# Patient Record
Sex: Female | Born: 1975
Health system: Southern US, Community
[De-identification: ages and names within clinical notes are randomized; demographics above are authoritative.]

## PROBLEM LIST (undated history)

## (undated) DIAGNOSIS — Z923 Personal history of irradiation: Secondary | ICD-10-CM

## (undated) DIAGNOSIS — Z30430 Encounter for insertion of intrauterine contraceptive device: Secondary | ICD-10-CM

## (undated) DIAGNOSIS — Z872 Personal history of diseases of the skin and subcutaneous tissue: Secondary | ICD-10-CM

## (undated) DIAGNOSIS — G43909 Migraine, unspecified, not intractable, without status migrainosus: Secondary | ICD-10-CM

## (undated) DIAGNOSIS — C801 Malignant (primary) neoplasm, unspecified: Secondary | ICD-10-CM

## (undated) DIAGNOSIS — F419 Anxiety disorder, unspecified: Secondary | ICD-10-CM

## (undated) DIAGNOSIS — F439 Reaction to severe stress, unspecified: Secondary | ICD-10-CM

## (undated) HISTORY — DX: Anxiety disorder, unspecified: F41.9

## (undated) HISTORY — PX: OTHER SURGICAL HISTORY: SHX169

## (undated) HISTORY — PX: TONSILLECTOMY AND ADENOIDECTOMY: SHX28

## (undated) HISTORY — DX: Migraine, unspecified, not intractable, without status migrainosus: G43.909

## (undated) HISTORY — DX: Encounter for insertion of intrauterine contraceptive device: Z30.430

## (undated) HISTORY — DX: Reaction to severe stress, unspecified: F43.9

## (undated) HISTORY — DX: Personal history of diseases of the skin and subcutaneous tissue: Z87.2

---

## 1998-03-11 ENCOUNTER — Other Ambulatory Visit: Admission: RE | Admit: 1998-03-11 | Discharge: 1998-03-11 | Payer: Self-pay | Admitting: Gynecology

## 1999-03-26 ENCOUNTER — Other Ambulatory Visit: Admission: RE | Admit: 1999-03-26 | Discharge: 1999-03-26 | Payer: Self-pay | Admitting: Gynecology

## 1999-06-27 ENCOUNTER — Emergency Department (HOSPITAL_COMMUNITY): Admission: EM | Admit: 1999-06-27 | Discharge: 1999-06-27 | Payer: Self-pay | Admitting: *Deleted

## 2000-02-12 ENCOUNTER — Other Ambulatory Visit: Admission: RE | Admit: 2000-02-12 | Discharge: 2000-02-12 | Payer: Self-pay | Admitting: Obstetrics & Gynecology

## 2000-09-05 ENCOUNTER — Inpatient Hospital Stay (HOSPITAL_COMMUNITY): Admission: AD | Admit: 2000-09-05 | Discharge: 2000-09-07 | Payer: Self-pay | Admitting: Obstetrics & Gynecology

## 2001-01-04 ENCOUNTER — Encounter: Admission: RE | Admit: 2001-01-04 | Discharge: 2001-02-03 | Payer: Self-pay | Admitting: Obstetrics & Gynecology

## 2001-03-06 ENCOUNTER — Encounter: Admission: RE | Admit: 2001-03-06 | Discharge: 2001-04-05 | Payer: Self-pay | Admitting: Obstetrics & Gynecology

## 2001-03-15 ENCOUNTER — Other Ambulatory Visit: Admission: RE | Admit: 2001-03-15 | Discharge: 2001-03-15 | Payer: Self-pay | Admitting: Gynecology

## 2001-04-06 ENCOUNTER — Encounter: Admission: RE | Admit: 2001-04-06 | Discharge: 2001-05-06 | Payer: Self-pay | Admitting: Obstetrics & Gynecology

## 2002-06-16 ENCOUNTER — Inpatient Hospital Stay (HOSPITAL_COMMUNITY): Admission: AD | Admit: 2002-06-16 | Discharge: 2002-06-18 | Payer: Self-pay | Admitting: Obstetrics & Gynecology

## 2002-07-17 ENCOUNTER — Other Ambulatory Visit: Admission: RE | Admit: 2002-07-17 | Discharge: 2002-07-17 | Payer: Self-pay | Admitting: Obstetrics and Gynecology

## 2004-01-15 ENCOUNTER — Other Ambulatory Visit: Admission: RE | Admit: 2004-01-15 | Discharge: 2004-01-15 | Payer: Self-pay | Admitting: Obstetrics and Gynecology

## 2005-01-18 ENCOUNTER — Other Ambulatory Visit: Admission: RE | Admit: 2005-01-18 | Discharge: 2005-01-18 | Payer: Self-pay | Admitting: Obstetrics and Gynecology

## 2010-03-01 ENCOUNTER — Encounter: Payer: Self-pay | Admitting: Obstetrics and Gynecology

## 2012-04-26 ENCOUNTER — Other Ambulatory Visit: Payer: Self-pay | Admitting: Obstetrics and Gynecology

## 2012-04-26 DIAGNOSIS — N63 Unspecified lump in unspecified breast: Secondary | ICD-10-CM

## 2012-05-05 ENCOUNTER — Other Ambulatory Visit: Payer: Self-pay

## 2012-05-08 ENCOUNTER — Other Ambulatory Visit: Payer: Self-pay

## 2012-05-18 ENCOUNTER — Ambulatory Visit
Admission: RE | Admit: 2012-05-18 | Discharge: 2012-05-18 | Disposition: A | Discharge status: Home / Self Care | Payer: Self-pay | Source: Ambulatory Visit | Attending: Obstetrics and Gynecology | Admitting: Obstetrics and Gynecology

## 2012-05-18 DIAGNOSIS — N63 Unspecified lump in unspecified breast: Secondary | ICD-10-CM

## 2012-12-21 ENCOUNTER — Other Ambulatory Visit: Payer: Self-pay | Admitting: *Deleted

## 2012-12-21 ENCOUNTER — Other Ambulatory Visit: Payer: Self-pay | Admitting: Obstetrics and Gynecology

## 2012-12-21 DIAGNOSIS — N644 Mastodynia: Secondary | ICD-10-CM

## 2013-01-17 ENCOUNTER — Ambulatory Visit
Admission: RE | Admit: 2013-01-17 | Discharge: 2013-01-17 | Disposition: A | Discharge status: Home / Self Care | Payer: BC Managed Care – PPO | Source: Ambulatory Visit | Attending: Obstetrics and Gynecology | Admitting: Obstetrics and Gynecology

## 2013-01-17 ENCOUNTER — Other Ambulatory Visit: Payer: Self-pay | Admitting: Obstetrics and Gynecology

## 2013-01-17 DIAGNOSIS — N644 Mastodynia: Secondary | ICD-10-CM

## 2013-08-27 ENCOUNTER — Encounter (INDEPENDENT_AMBULATORY_CARE_PROVIDER_SITE_OTHER): Payer: Self-pay | Admitting: Surgery

## 2013-09-11 ENCOUNTER — Ambulatory Visit (INDEPENDENT_AMBULATORY_CARE_PROVIDER_SITE_OTHER): Payer: Self-pay | Admitting: Surgery

## 2013-09-13 ENCOUNTER — Ambulatory Visit (INDEPENDENT_AMBULATORY_CARE_PROVIDER_SITE_OTHER): Payer: BC Managed Care – PPO | Admitting: Surgery

## 2013-10-05 ENCOUNTER — Ambulatory Visit (INDEPENDENT_AMBULATORY_CARE_PROVIDER_SITE_OTHER): Payer: BC Managed Care – PPO | Admitting: Surgery

## 2013-10-31 ENCOUNTER — Other Ambulatory Visit: Payer: Self-pay

## 2013-10-31 DIAGNOSIS — Z1231 Encounter for screening mammogram for malignant neoplasm of breast: Secondary | ICD-10-CM

## 2013-12-06 ENCOUNTER — Ambulatory Visit
Admission: RE | Admit: 2013-12-06 | Discharge: 2013-12-06 | Disposition: A | Discharge status: Home / Self Care | Payer: BC Managed Care – PPO | Source: Ambulatory Visit

## 2013-12-06 DIAGNOSIS — Z1231 Encounter for screening mammogram for malignant neoplasm of breast: Secondary | ICD-10-CM

## 2014-11-05 ENCOUNTER — Other Ambulatory Visit: Payer: Self-pay

## 2014-11-05 DIAGNOSIS — Z1231 Encounter for screening mammogram for malignant neoplasm of breast: Secondary | ICD-10-CM

## 2014-12-10 ENCOUNTER — Ambulatory Visit
Admission: RE | Admit: 2014-12-10 | Discharge: 2014-12-10 | Disposition: A | Discharge status: Home / Self Care | Payer: BLUE CROSS/BLUE SHIELD | Source: Ambulatory Visit

## 2014-12-10 DIAGNOSIS — Z1231 Encounter for screening mammogram for malignant neoplasm of breast: Secondary | ICD-10-CM

## 2015-02-07 ENCOUNTER — Other Ambulatory Visit: Payer: Self-pay | Admitting: Obstetrics and Gynecology

## 2015-02-07 DIAGNOSIS — M79621 Pain in right upper arm: Secondary | ICD-10-CM

## 2015-03-19 ENCOUNTER — Ambulatory Visit
Admission: RE | Admit: 2015-03-19 | Discharge: 2015-03-19 | Disposition: A | Discharge status: Home / Self Care | Payer: BLUE CROSS/BLUE SHIELD | Source: Ambulatory Visit | Attending: Obstetrics and Gynecology | Admitting: Obstetrics and Gynecology

## 2015-03-19 DIAGNOSIS — M79621 Pain in right upper arm: Secondary | ICD-10-CM

## 2015-12-08 ENCOUNTER — Other Ambulatory Visit: Payer: Self-pay | Admitting: Obstetrics and Gynecology

## 2015-12-08 DIAGNOSIS — Z1231 Encounter for screening mammogram for malignant neoplasm of breast: Secondary | ICD-10-CM

## 2016-01-29 ENCOUNTER — Ambulatory Visit
Admission: RE | Admit: 2016-01-29 | Discharge: 2016-01-29 | Disposition: A | Discharge status: Home / Self Care | Payer: BLUE CROSS/BLUE SHIELD | Source: Ambulatory Visit | Attending: Obstetrics and Gynecology | Admitting: Obstetrics and Gynecology

## 2016-01-29 DIAGNOSIS — Z1231 Encounter for screening mammogram for malignant neoplasm of breast: Secondary | ICD-10-CM

## 2016-02-24 ENCOUNTER — Other Ambulatory Visit: Payer: Self-pay | Admitting: Obstetrics and Gynecology

## 2016-02-27 LAB — CYTOLOGY - PAP

## 2017-01-03 ENCOUNTER — Other Ambulatory Visit: Payer: Self-pay | Admitting: Obstetrics and Gynecology

## 2017-01-03 DIAGNOSIS — Z1231 Encounter for screening mammogram for malignant neoplasm of breast: Secondary | ICD-10-CM

## 2017-02-04 ENCOUNTER — Ambulatory Visit
Admission: RE | Admit: 2017-02-04 | Discharge: 2017-02-04 | Disposition: A | Discharge status: Home / Self Care | Payer: BLUE CROSS/BLUE SHIELD | Source: Ambulatory Visit | Attending: Obstetrics and Gynecology | Admitting: Obstetrics and Gynecology

## 2017-02-04 DIAGNOSIS — Z1231 Encounter for screening mammogram for malignant neoplasm of breast: Secondary | ICD-10-CM

## 2017-12-28 ENCOUNTER — Other Ambulatory Visit: Payer: Self-pay | Admitting: Obstetrics and Gynecology

## 2017-12-28 DIAGNOSIS — Z1231 Encounter for screening mammogram for malignant neoplasm of breast: Secondary | ICD-10-CM

## 2018-02-13 ENCOUNTER — Ambulatory Visit
Admission: RE | Admit: 2018-02-13 | Discharge: 2018-02-13 | Disposition: A | Discharge status: Home / Self Care | Payer: BLUE CROSS/BLUE SHIELD | Source: Ambulatory Visit | Attending: Obstetrics and Gynecology | Admitting: Obstetrics and Gynecology

## 2018-02-13 DIAGNOSIS — Z1231 Encounter for screening mammogram for malignant neoplasm of breast: Secondary | ICD-10-CM

## 2018-04-17 DIAGNOSIS — Z23 Encounter for immunization: Secondary | ICD-10-CM | POA: Diagnosis not present

## 2018-04-17 DIAGNOSIS — B078 Other viral warts: Secondary | ICD-10-CM | POA: Diagnosis not present

## 2019-01-02 DIAGNOSIS — Z20828 Contact with and (suspected) exposure to other viral communicable diseases: Secondary | ICD-10-CM | POA: Diagnosis not present

## 2019-01-18 ENCOUNTER — Other Ambulatory Visit: Payer: Self-pay | Admitting: Obstetrics and Gynecology

## 2019-01-18 DIAGNOSIS — Z1231 Encounter for screening mammogram for malignant neoplasm of breast: Secondary | ICD-10-CM

## 2019-02-06 DIAGNOSIS — Z6822 Body mass index (BMI) 22.0-22.9, adult: Secondary | ICD-10-CM | POA: Diagnosis not present

## 2019-02-06 DIAGNOSIS — Z30431 Encounter for routine checking of intrauterine contraceptive device: Secondary | ICD-10-CM | POA: Diagnosis not present

## 2019-02-26 DIAGNOSIS — D225 Melanocytic nevi of trunk: Secondary | ICD-10-CM | POA: Diagnosis not present

## 2019-02-26 DIAGNOSIS — Z86018 Personal history of other benign neoplasm: Secondary | ICD-10-CM | POA: Diagnosis not present

## 2019-02-26 DIAGNOSIS — L578 Other skin changes due to chronic exposure to nonionizing radiation: Secondary | ICD-10-CM | POA: Diagnosis not present

## 2019-02-26 DIAGNOSIS — L570 Actinic keratosis: Secondary | ICD-10-CM | POA: Diagnosis not present

## 2019-02-26 DIAGNOSIS — L814 Other melanin hyperpigmentation: Secondary | ICD-10-CM | POA: Diagnosis not present

## 2019-03-06 DIAGNOSIS — M545 Low back pain: Secondary | ICD-10-CM | POA: Diagnosis not present

## 2019-03-06 DIAGNOSIS — L709 Acne, unspecified: Secondary | ICD-10-CM | POA: Diagnosis not present

## 2019-03-06 DIAGNOSIS — G43909 Migraine, unspecified, not intractable, without status migrainosus: Secondary | ICD-10-CM | POA: Diagnosis not present

## 2019-03-06 DIAGNOSIS — F418 Other specified anxiety disorders: Secondary | ICD-10-CM | POA: Diagnosis not present

## 2019-03-09 ENCOUNTER — Ambulatory Visit
Admission: RE | Admit: 2019-03-09 | Discharge: 2019-03-09 | Disposition: A | Discharge status: Home / Self Care | Payer: BLUE CROSS/BLUE SHIELD | Source: Ambulatory Visit | Attending: Obstetrics and Gynecology | Admitting: Obstetrics and Gynecology

## 2019-03-09 DIAGNOSIS — Z1231 Encounter for screening mammogram for malignant neoplasm of breast: Secondary | ICD-10-CM

## 2019-03-14 ENCOUNTER — Other Ambulatory Visit: Payer: Self-pay | Admitting: Obstetrics and Gynecology

## 2019-03-14 DIAGNOSIS — R928 Other abnormal and inconclusive findings on diagnostic imaging of breast: Secondary | ICD-10-CM

## 2019-03-16 ENCOUNTER — Ambulatory Visit
Admission: RE | Admit: 2019-03-16 | Discharge: 2019-03-16 | Disposition: A | Discharge status: Home / Self Care | Payer: BC Managed Care – PPO | Source: Ambulatory Visit | Attending: Obstetrics and Gynecology | Admitting: Obstetrics and Gynecology

## 2019-03-16 ENCOUNTER — Other Ambulatory Visit: Payer: Self-pay

## 2019-03-16 ENCOUNTER — Other Ambulatory Visit: Payer: Self-pay | Admitting: Obstetrics and Gynecology

## 2019-03-16 DIAGNOSIS — R922 Inconclusive mammogram: Secondary | ICD-10-CM | POA: Diagnosis not present

## 2019-03-16 DIAGNOSIS — R928 Other abnormal and inconclusive findings on diagnostic imaging of breast: Secondary | ICD-10-CM

## 2019-03-16 DIAGNOSIS — N6489 Other specified disorders of breast: Secondary | ICD-10-CM | POA: Diagnosis not present

## 2019-03-26 DIAGNOSIS — Z124 Encounter for screening for malignant neoplasm of cervix: Secondary | ICD-10-CM | POA: Diagnosis not present

## 2019-03-26 DIAGNOSIS — Z6822 Body mass index (BMI) 22.0-22.9, adult: Secondary | ICD-10-CM | POA: Diagnosis not present

## 2019-03-26 DIAGNOSIS — Z01419 Encounter for gynecological examination (general) (routine) without abnormal findings: Secondary | ICD-10-CM | POA: Diagnosis not present

## 2019-05-09 DIAGNOSIS — F902 Attention-deficit hyperactivity disorder, combined type: Secondary | ICD-10-CM | POA: Diagnosis not present

## 2019-05-09 DIAGNOSIS — F419 Anxiety disorder, unspecified: Secondary | ICD-10-CM | POA: Diagnosis not present

## 2019-05-09 DIAGNOSIS — Z79899 Other long term (current) drug therapy: Secondary | ICD-10-CM | POA: Diagnosis not present

## 2019-06-07 DIAGNOSIS — M531 Cervicobrachial syndrome: Secondary | ICD-10-CM | POA: Diagnosis not present

## 2019-06-07 DIAGNOSIS — G4489 Other headache syndrome: Secondary | ICD-10-CM | POA: Diagnosis not present

## 2019-06-07 DIAGNOSIS — M9902 Segmental and somatic dysfunction of thoracic region: Secondary | ICD-10-CM | POA: Diagnosis not present

## 2019-06-07 DIAGNOSIS — M9901 Segmental and somatic dysfunction of cervical region: Secondary | ICD-10-CM | POA: Diagnosis not present

## 2019-06-18 DIAGNOSIS — M531 Cervicobrachial syndrome: Secondary | ICD-10-CM | POA: Diagnosis not present

## 2019-06-18 DIAGNOSIS — G4489 Other headache syndrome: Secondary | ICD-10-CM | POA: Diagnosis not present

## 2019-06-18 DIAGNOSIS — M9902 Segmental and somatic dysfunction of thoracic region: Secondary | ICD-10-CM | POA: Diagnosis not present

## 2019-06-18 DIAGNOSIS — M9901 Segmental and somatic dysfunction of cervical region: Secondary | ICD-10-CM | POA: Diagnosis not present

## 2019-07-23 DIAGNOSIS — G4489 Other headache syndrome: Secondary | ICD-10-CM | POA: Diagnosis not present

## 2019-07-23 DIAGNOSIS — M5382 Other specified dorsopathies, cervical region: Secondary | ICD-10-CM | POA: Diagnosis not present

## 2019-07-23 DIAGNOSIS — M9901 Segmental and somatic dysfunction of cervical region: Secondary | ICD-10-CM | POA: Diagnosis not present

## 2019-07-23 DIAGNOSIS — M62838 Other muscle spasm: Secondary | ICD-10-CM | POA: Diagnosis not present

## 2019-08-17 DIAGNOSIS — Z20822 Contact with and (suspected) exposure to covid-19: Secondary | ICD-10-CM | POA: Diagnosis not present

## 2019-09-18 ENCOUNTER — Other Ambulatory Visit: Payer: Self-pay

## 2019-09-18 ENCOUNTER — Ambulatory Visit
Admission: RE | Admit: 2019-09-18 | Discharge: 2019-09-18 | Disposition: A | Discharge status: Home / Self Care | Payer: BC Managed Care – PPO | Source: Ambulatory Visit | Attending: Obstetrics and Gynecology | Admitting: Obstetrics and Gynecology

## 2019-09-18 ENCOUNTER — Other Ambulatory Visit: Payer: Self-pay | Admitting: Obstetrics and Gynecology

## 2019-09-18 DIAGNOSIS — R928 Other abnormal and inconclusive findings on diagnostic imaging of breast: Secondary | ICD-10-CM

## 2019-09-18 DIAGNOSIS — N6311 Unspecified lump in the right breast, upper outer quadrant: Secondary | ICD-10-CM | POA: Diagnosis not present

## 2019-09-18 DIAGNOSIS — R922 Inconclusive mammogram: Secondary | ICD-10-CM | POA: Diagnosis not present

## 2019-10-02 DIAGNOSIS — Z20822 Contact with and (suspected) exposure to covid-19: Secondary | ICD-10-CM | POA: Diagnosis not present

## 2019-10-06 DIAGNOSIS — U071 COVID-19: Secondary | ICD-10-CM | POA: Diagnosis not present

## 2019-11-06 DIAGNOSIS — Z79899 Other long term (current) drug therapy: Secondary | ICD-10-CM | POA: Diagnosis not present

## 2019-11-06 DIAGNOSIS — Z049 Encounter for examination and observation for unspecified reason: Secondary | ICD-10-CM | POA: Diagnosis not present

## 2019-11-06 DIAGNOSIS — G43719 Chronic migraine without aura, intractable, without status migrainosus: Secondary | ICD-10-CM | POA: Diagnosis not present

## 2019-11-06 DIAGNOSIS — G43119 Migraine with aura, intractable, without status migrainosus: Secondary | ICD-10-CM | POA: Diagnosis not present

## 2019-11-20 DIAGNOSIS — M542 Cervicalgia: Secondary | ICD-10-CM | POA: Diagnosis not present

## 2019-11-20 DIAGNOSIS — G43719 Chronic migraine without aura, intractable, without status migrainosus: Secondary | ICD-10-CM | POA: Diagnosis not present

## 2019-11-20 DIAGNOSIS — G518 Other disorders of facial nerve: Secondary | ICD-10-CM | POA: Diagnosis not present

## 2019-11-20 DIAGNOSIS — M791 Myalgia, unspecified site: Secondary | ICD-10-CM | POA: Diagnosis not present

## 2019-11-20 DIAGNOSIS — G43119 Migraine with aura, intractable, without status migrainosus: Secondary | ICD-10-CM | POA: Diagnosis not present

## 2019-12-05 DIAGNOSIS — M791 Myalgia, unspecified site: Secondary | ICD-10-CM | POA: Diagnosis not present

## 2019-12-05 DIAGNOSIS — G43719 Chronic migraine without aura, intractable, without status migrainosus: Secondary | ICD-10-CM | POA: Diagnosis not present

## 2019-12-05 DIAGNOSIS — G518 Other disorders of facial nerve: Secondary | ICD-10-CM | POA: Diagnosis not present

## 2019-12-05 DIAGNOSIS — M542 Cervicalgia: Secondary | ICD-10-CM | POA: Diagnosis not present

## 2019-12-05 DIAGNOSIS — G43119 Migraine with aura, intractable, without status migrainosus: Secondary | ICD-10-CM | POA: Diagnosis not present

## 2019-12-31 DIAGNOSIS — G518 Other disorders of facial nerve: Secondary | ICD-10-CM | POA: Diagnosis not present

## 2019-12-31 DIAGNOSIS — G43119 Migraine with aura, intractable, without status migrainosus: Secondary | ICD-10-CM | POA: Diagnosis not present

## 2019-12-31 DIAGNOSIS — G43719 Chronic migraine without aura, intractable, without status migrainosus: Secondary | ICD-10-CM | POA: Diagnosis not present

## 2019-12-31 DIAGNOSIS — M791 Myalgia, unspecified site: Secondary | ICD-10-CM | POA: Diagnosis not present

## 2019-12-31 DIAGNOSIS — M542 Cervicalgia: Secondary | ICD-10-CM | POA: Diagnosis not present

## 2020-01-15 DIAGNOSIS — G43719 Chronic migraine without aura, intractable, without status migrainosus: Secondary | ICD-10-CM | POA: Diagnosis not present

## 2020-01-15 DIAGNOSIS — M791 Myalgia, unspecified site: Secondary | ICD-10-CM | POA: Diagnosis not present

## 2020-01-15 DIAGNOSIS — M542 Cervicalgia: Secondary | ICD-10-CM | POA: Diagnosis not present

## 2020-01-15 DIAGNOSIS — G518 Other disorders of facial nerve: Secondary | ICD-10-CM | POA: Diagnosis not present

## 2020-01-15 DIAGNOSIS — G43119 Migraine with aura, intractable, without status migrainosus: Secondary | ICD-10-CM | POA: Diagnosis not present

## 2020-01-28 DIAGNOSIS — G43719 Chronic migraine without aura, intractable, without status migrainosus: Secondary | ICD-10-CM | POA: Diagnosis not present

## 2020-01-28 DIAGNOSIS — G43119 Migraine with aura, intractable, without status migrainosus: Secondary | ICD-10-CM | POA: Diagnosis not present

## 2020-02-12 DIAGNOSIS — Z0000 Encounter for general adult medical examination without abnormal findings: Secondary | ICD-10-CM | POA: Diagnosis not present

## 2020-02-14 DIAGNOSIS — M791 Myalgia, unspecified site: Secondary | ICD-10-CM | POA: Diagnosis not present

## 2020-02-14 DIAGNOSIS — G43119 Migraine with aura, intractable, without status migrainosus: Secondary | ICD-10-CM | POA: Diagnosis not present

## 2020-02-14 DIAGNOSIS — G518 Other disorders of facial nerve: Secondary | ICD-10-CM | POA: Diagnosis not present

## 2020-02-14 DIAGNOSIS — M542 Cervicalgia: Secondary | ICD-10-CM | POA: Diagnosis not present

## 2020-02-14 DIAGNOSIS — G43719 Chronic migraine without aura, intractable, without status migrainosus: Secondary | ICD-10-CM | POA: Diagnosis not present

## 2020-02-19 ENCOUNTER — Other Ambulatory Visit: Payer: Self-pay | Admitting: Internal Medicine

## 2020-02-19 ENCOUNTER — Ambulatory Visit
Admission: RE | Admit: 2020-02-19 | Discharge: 2020-02-19 | Disposition: A | Discharge status: Home / Self Care | Payer: BC Managed Care – PPO | Source: Ambulatory Visit | Attending: Internal Medicine | Admitting: Internal Medicine

## 2020-02-19 DIAGNOSIS — R519 Headache, unspecified: Secondary | ICD-10-CM | POA: Diagnosis not present

## 2020-02-19 DIAGNOSIS — Z1389 Encounter for screening for other disorder: Secondary | ICD-10-CM | POA: Diagnosis not present

## 2020-02-19 DIAGNOSIS — R82998 Other abnormal findings in urine: Secondary | ICD-10-CM | POA: Diagnosis not present

## 2020-02-19 DIAGNOSIS — G43909 Migraine, unspecified, not intractable, without status migrainosus: Secondary | ICD-10-CM | POA: Diagnosis not present

## 2020-02-19 DIAGNOSIS — Z1331 Encounter for screening for depression: Secondary | ICD-10-CM | POA: Diagnosis not present

## 2020-02-19 DIAGNOSIS — Z0000 Encounter for general adult medical examination without abnormal findings: Secondary | ICD-10-CM | POA: Diagnosis not present

## 2020-02-21 ENCOUNTER — Telehealth: Payer: Self-pay | Admitting: Neurology

## 2020-02-25 ENCOUNTER — Ambulatory Visit: Payer: BC Managed Care – PPO | Admitting: Neurology

## 2020-02-27 DIAGNOSIS — D225 Melanocytic nevi of trunk: Secondary | ICD-10-CM | POA: Diagnosis not present

## 2020-02-27 DIAGNOSIS — L578 Other skin changes due to chronic exposure to nonionizing radiation: Secondary | ICD-10-CM | POA: Diagnosis not present

## 2020-02-27 DIAGNOSIS — L814 Other melanin hyperpigmentation: Secondary | ICD-10-CM | POA: Diagnosis not present

## 2020-02-27 DIAGNOSIS — L570 Actinic keratosis: Secondary | ICD-10-CM | POA: Diagnosis not present

## 2020-02-27 DIAGNOSIS — Z86018 Personal history of other benign neoplasm: Secondary | ICD-10-CM | POA: Diagnosis not present

## 2020-02-28 ENCOUNTER — Encounter: Payer: Self-pay | Admitting: *Deleted

## 2020-02-28 DIAGNOSIS — Z1212 Encounter for screening for malignant neoplasm of rectum: Secondary | ICD-10-CM | POA: Diagnosis not present

## 2020-02-29 ENCOUNTER — Other Ambulatory Visit: Payer: Self-pay

## 2020-02-29 ENCOUNTER — Ambulatory Visit: Payer: BC Managed Care – PPO | Admitting: Neurology

## 2020-02-29 ENCOUNTER — Encounter: Payer: Self-pay | Admitting: Neurology

## 2020-02-29 VITALS — BP 106/69 | HR 74 | Ht 66.0 in | Wt 124.6 lb

## 2020-02-29 DIAGNOSIS — G43009 Migraine without aura, not intractable, without status migrainosus: Secondary | ICD-10-CM

## 2020-02-29 DIAGNOSIS — M542 Cervicalgia: Secondary | ICD-10-CM | POA: Diagnosis not present

## 2020-02-29 DIAGNOSIS — M5481 Occipital neuralgia: Secondary | ICD-10-CM | POA: Diagnosis not present

## 2020-02-29 DIAGNOSIS — M5412 Radiculopathy, cervical region: Secondary | ICD-10-CM

## 2020-02-29 DIAGNOSIS — M4722 Other spondylosis with radiculopathy, cervical region: Secondary | ICD-10-CM

## 2020-02-29 DIAGNOSIS — G8929 Other chronic pain: Secondary | ICD-10-CM

## 2020-02-29 MED ORDER — CYCLOBENZAPRINE HCL 5 MG PO TABS: 5.0000 mg | ORAL_TABLET | Freq: Every day | ORAL | 1 refills | Status: DC

## 2020-02-29 NOTE — Patient Instructions (Addendum)
- Try Ajovy in case atypical migraine(monthly injections) - If cervicogenic try flexeril at bedtime(muscle relaxer), recommended PT at [place] with dry needling, MRI cervical spine - If occipital neuralgia and since she had good relief with occipital nerve blocks(what you had completed with Dr Domingo Cocking injections temporary) may consider MRI cervical spine, Epidural Steroid Injections(injectionsinto the neck) or Radio frequency ablation of the nerve (more permanent procedure on the occipital nerve to make it less sensitive done by send to Dr. Ernestina Patches)  Cyclobenzaprine tablets What is this medicine? CYCLOBENZAPRINE (sye kloe BEN za preen) is a muscle relaxer. It is used to treat muscle pain, spasms, and stiffness. This medicine may be used for other purposes; ask your health care provider or pharmacist if you have questions. COMMON BRAND NAME(S): Fexmid, Flexeril What should I tell my health care provider before I take this medicine? They need to know if you have any of these conditions:  heart disease, irregular heartbeat, or previous heart attack  liver disease  thyroid problem  an unusual or allergic reaction to cyclobenzaprine, tricyclic antidepressants, lactose, other medicines, foods, dyes, or preservatives  pregnant or trying to get pregnant  breast-feeding How should I use this medicine? Take this medicine by mouth with a glass of water. Follow the directions on the prescription label. If this medicine upsets your stomach, take it with food or milk. Take your medicine at regular intervals. Do not take it more often than directed. Talk to your pediatrician regarding the use of this medicine in children. Special care may be needed. Overdosage: If you think you have taken too much of this medicine contact a poison control center or emergency room at once. NOTE: This medicine is only for you. Do not share this medicine with others. What if I miss a dose? If you miss a dose, take it as  soon as you can. If it is almost time for your next dose, take only that dose. Do not take double or extra doses. What may interact with this medicine? Do not take this medicine with any of the following medications:  MAOIs like Carbex, Eldepryl, Marplan, Nardil, and Parnate  narcotic medicines for cough  safinamide This medicine may also interact with the following medications:  alcohol  bupropion  antihistamines for allergy, cough and cold  certain medicines for anxiety or sleep  certain medicines for bladder problems like oxybutynin, tolterodine  certain medicines for depression like amitriptyline, fluoxetine, sertraline  certain medicines for Parkinson's disease like benztropine, trihexyphenidyl  certain medicines for seizures like phenobarbital, primidone  certain medicines for stomach problems like dicyclomine, hyoscyamine  certain medicines for travel sickness like scopolamine  general anesthetics like halothane, isoflurane, methoxyflurane, propofol  ipratropium  local anesthetics like lidocaine, pramoxine, tetracaine  medicines that relax muscles for surgery  narcotic medicines for pain  phenothiazines like chlorpromazine, mesoridazine, prochlorperazine, thioridazine  verapamil This list may not describe all possible interactions. Give your health care provider a list of all the medicines, herbs, non-prescription drugs, or dietary supplements you use. Also tell them if you smoke, drink alcohol, or use illegal drugs. Some items may interact with your medicine. What should I watch for while using this medicine? Tell your doctor or health care professional if your symptoms do not start to get better or if they get worse. You may get drowsy or dizzy. Do not drive, use machinery, or do anything that needs mental alertness until you know how this medicine affects you. Do not stand or sit up quickly, especially  if you are an older patient. This reduces the risk of  dizzy or fainting spells. Alcohol may interfere with the effect of this medicine. Avoid alcoholic drinks. If you are taking another medicine that also causes drowsiness, you may have more side effects. Give your health care provider a list of all medicines you use. Your doctor will tell you how much medicine to take. Do not take more medicine than directed. Call emergency for help if you have problems breathing or unusual sleepiness. Your mouth may get dry. Chewing sugarless gum or sucking hard candy, and drinking plenty of water may help. Contact your doctor if the problem does not go away or is severe. What side effects may I notice from receiving this medicine? Side effects that you should report to your doctor or health care professional as soon as possible:  allergic reactions like skin rash, itching or hives, swelling of the face, lips, or tongue  breathing problems  chest pain  fast, irregular heartbeat  hallucinations  seizures  unusually weak or tired Side effects that usually do not require medical attention (report to your doctor or health care professional if they continue or are bothersome):  headache  nausea, vomiting This list may not describe all possible side effects. Call your doctor for medical advice about side effects. You may report side effects to FDA at 1-800-FDA-1088. Where should I keep my medicine? Keep out of the reach of children. Store at room temperature between 15 and 30 degrees C (59 and 86 degrees F). Keep container tightly closed. Throw away any unused medicine after the expiration date. NOTE: This sheet is a summary. It may not cover all possible information. If you have questions about this medicine, talk to your doctor, pharmacist, or health care provider.  2021 Elsevier/Gold Standard (2017-12-28 12:49:26)  Occipital Nerve Block Patient Information  Description: The occipital nerves originate in the cervical (neck) spinal cord and travel upward  through muscle and tissue to supply sensation to the back of the head and top of the scalp.  In addition, the nerves control some of the muscles of the scalp.  Occipital neuralgia is an irritation of these nerves which can cause headaches, numbness of the scalp, and neck discomfort.     The occipital nerve block will interrupt nerve transmission through these nerves and can relieve pain and spasm.  The block consists of insertion of a small needle under the skin in the back of the head to deposit local anesthetic (numbing medicine) and/or steroids around the nerve.  The entire block usually lasts less than 5 minutes.  Conditions which may be treated by occipital blocks:   Muscular pain and spasm of the scalp  Nerve irritation, back of the head  Headaches  Upper neck pain  Preparation for the injection:  1. Do not eat any solid food or dairy products within 8 hours of your appointment. 2. You may drink clear liquids up to 3 hours before appointment.  Clear liquids include water, black coffee, juice or soda.  No milk or cream please. 3. You may take your regular medication, including pain medications, with a sip of water before you appointment.  Diabetics should hold regular insulin (if taken separately) and take 1/2 normal NPH dose the morning of the procedure.  Carry some sugar containing items with you to your appointment. 4. A driver must accompany you and be prepared to drive you home after your procedure. 5. Bring all your current medications with you. 6. An  IV may be inserted and sedation may be given at the discretion of the physician. 7. A blood pressure cuff, EKG, and other monitors will often be applied during the procedure.  Some patients may need to have extra oxygen administered for a short period. 8. You will be asked to provide medical information, including your allergies and medications, prior to the procedure.  We must know immediately if you are taking blood thinners (like  Coumadin/Warfarin) or if you are allergic to IV iodine contrast (dye).  We must know if you could possible be pregnant.  9. Do not wear a high collared shirt or turtleneck.  Tie long hair up in the back if possible.  Possible side-effects:   Bleeding from needle site  Infection (rare, may require surgery)  Nerve injury (rare)  Hair on back of neck can be tinged with iodine scrub (this will wash out)  Light-headedness (temporary)  Pain at injection site (several days)  Decreased blood pressure (rare, temporary)  Seizure (very rare)  Call if you experience:   Hives or difficulty breathing ( go to the emergency room)  Inflammation or drainage at the injection site(s)  Please note:  Although the local anesthetic injected can often make your painful muscles or headache feel good for several hours after the injection, the pain may return.  It takes 3-7 days for steroids to work.  You may not notice any pain relief for at least one week.  If effective, we will often do a series of injections spaced 3-6 weeks apart to maximally decrease your pain.  If you have any questions, please call [phone] [place] injection What is this medicine? FREMANEZUMAB (fre ma NEZ ue mab) is used to prevent migraine headaches. This medicine may be used for other purposes; ask your health care provider or pharmacist if you have questions. COMMON BRAND NAME(S): AJOVY What should I tell my health care provider before I take this medicine? They need to know if you have any of these conditions:  an unusual or allergic reaction to fremanezumab, other medicines, foods, dyes, or preservatives  pregnant or trying to get pregnant  breast-feeding How should I use this medicine? This medicine is for injection under the skin. You will be taught how to prepare and give this medicine. Use exactly as directed. Take your medicine at regular intervals. Do  not take your medicine more often than directed. It is important that you put your used needles and syringes in a special sharps container. Do not put them in a trash can. If you do not have a sharps container, call your pharmacist or healthcare provider to get one. Talk to your pediatrician regarding the use of this medicine in children. Special care may be needed. Overdosage: If you think you have taken too much of this medicine contact a poison control center or emergency room at once. NOTE: This medicine is only for you. Do not share this medicine with others. What if I miss a dose? If you miss a dose, take it as soon as you can. If it is almost time for your next dose, take only that dose. Do not take double or extra doses. What may interact with this medicine? Interactions are not expected. This list may not describe all possible interactions. Give your health care provider a list of all the medicines, herbs, non-prescription drugs, or dietary supplements you use. Also tell them if you smoke, drink alcohol, or use illegal drugs.  Some items may interact with your medicine. What should I watch for while using this medicine? Tell your doctor or healthcare professional if your symptoms do not start to get better or if they get worse. What side effects may I notice from receiving this medicine? Side effects that you should report to your doctor or health care professional as soon as possible:  allergic reactions like skin rash, itching or hives, swelling of the face, lips, or tongue Side effects that usually do not require medical attention (report these to your doctor or health care professional if they continue or are bothersome):  pain, redness, or irritation at site where injected This list may not describe all possible side effects. Call your doctor for medical advice about side effects. You may report side effects to FDA at 1-800-FDA-1088. Where should I keep my medicine? Keep out of the  reach of children. You will be instructed on how to store this medicine. Throw away any unused medicine after the expiration date on the label. NOTE: This sheet is a summary. It may not cover all possible information. If you have questions about this medicine, talk to your doctor, pharmacist, or health care provider.  2021 Elsevier/Gold Standard (2016-10-25 17:22:56)   Occipital Neuralgia  Occipital neuralgia is a type of headache that causes brief episodes of very bad pain in the back of the head. Pain from occipital neuralgia may spread (radiate) to other parts of the head. These headaches may be caused by irritation of the nerves that leave the spinal cord high up in the neck, just below the base of the skull (occipital nerves). The occipital nerves transmit sensations from the back of the head, the top of the head, and the areas behind the ears. What are the causes? This condition can occur without any known cause (primary headache syndrome). In other cases, this condition is caused by pressure on or irritation of one of the two occipital nerves. Pressure and irritation may be due to:  Muscle spasm in the neck.  Neck injury.  Wear and tear of the vertebrae in the neck (osteoarthritis).  Disease of the disks that separate the vertebrae.  Swollen blood vessels that put pressure on the occipital nerves.  Infections.  Tumors.  Diabetes. What are the signs or symptoms? This condition causes brief burning, stabbing, electric, shocking, or shooting pain in the back of the head that can radiate to the top of the head. It can happen on one side or both sides of the head. It can also cause:  Pain behind the eye.  Pain triggered by neck movement or hair brushing.  Scalp tenderness.  Aching in the back of the head between episodes of very bad pain.  Pain that gets worse with exposure to bright lights. How is this diagnosed? Your health care provider may diagnose the condition based  on a physical exam and your symptoms. Tests may be done, such as:  Imaging studies of the brain and neck (cervical spine), such as an MRI or CT scan. These look for causes of pinched nerves.  Applying pressure to the nerves in the neck to try to re-create the pain.  Injection of numbing medicine into the occipital nerve areas to see if pain goes away (diagnostic nerve block).   How is this treated? Treatment for this condition may begin with simple measures, such as:  Rest.  Massage.  Applying heat or cold to the area.  Over-the-counter pain relievers. If these measures do not work,  you may need other treatments, including:  Medicines, such as: ? Prescription-strength anti-inflammatory medicines. ? Muscle relaxants. ? Anti-seizure medicines, which can relieve pain. ? Antidepressants, which can relieve pain. ? Injected medicines, such as medicines that numb the area (local anesthetic) and steroids.  Pulsed radiofrequency ablation. This is when wires are implanted to deliver electrical impulses that block pain signals from the occipital nerve.  Surgery to relieve nerve pressure.  Physical therapy. Follow these instructions at home: Managing pain  Avoid any activities that cause pain.  Rest when you have an attack of pain.  Try gentle massage to relieve pain.  Try a different pillow or sleeping position.  If directed, apply heat to the affected area as often as told by your health care provider. Use the heat source that your health care provider recommends, such as a moist heat pack or a heating pad. ? Place a towel between your skin and the heat source. ? Leave the heat on for 20-30 minutes. ? Remove the heat if your skin turns bright red. This is especially important if you are unable to feel pain, heat, or cold. You have a greater risk of getting burned.  If directed, put ice on the back of your head and neck area. To do this: ? Put ice in a plastic bag. ? Place a  towel between your skin and the bag. ? Leave the ice on for 20 minutes, 2-3 times a day. ? Remove the ice if your skin turns bright red. This is very important. If you cannot feel pain, heat, or cold, you have a greater risk of damage to the area.      General instructions  Take over-the-counter and prescription medicines only as told by your health care provider.  Avoid things that make your symptoms worse, such as bright lights.  Try to stay active. Get regular exercise that does not cause pain. Ask your health care provider to suggest safe exercises for you.  Work with a physical therapist to learn stretching exercises you can do at home.  Practice good posture.  Keep all follow-up visits. This is important. Contact a health care provider if:  Your medicine is not working.  You have new or worsening symptoms. Get help right away if:  You have very bad head pain that does not go away.  You have a sudden change in vision, balance, or speech. These symptoms may represent a serious problem that is an emergency. Do not wait to see if the symptoms will go away. Get medical help right away. Call your local emergency services (911 in the [place] Do not drive yourself to the hospital. Summary  Occipital neuralgia is a type of headache that causes brief episodes of very bad pain in the back of the head.  Pain from occipital neuralgia may spread (radiate) to other parts of the head.  Treatment for this condition includes rest, massage, and medicines. This information is not intended to replace advice given to you by your health care provider. Make sure you discuss any questions you have with your health care provider. Document Revised: 11/25/2019 Document Reviewed: 11/25/2019 Elsevier Patient Education  2021 [employer].

## 2020-02-29 NOTE — Progress Notes (Addendum)
[facility]    Provider:  Dr Jaynee Eagles Requesting Provider: Shon Baton, MD Primary Care Provider:  [facility] [place]  CC:  migraines  HPI:  Rachael Garza is a 45 y.o. female here as requested by Shon Baton, MD for 2nd opinion on migraines.  She has a past medical history of migraine, severe anxiety.  I reviewed Dr. Keane Police notes, patient has a history of headaches, migraines off and on, no more tension and neck tightness, she has been to chiropractor, she has been evaluated and [facility], she was placed on Topamax and went to 75 mg and then had side effects which included brain fog and out of body experiences, had an episode of panic attack for a few hours could not breathe cannot drink, no focal neurodeficits, she came off of Topamax slowly, saw Dr. Domingo Cocking who thought it was more likely dissociation, she has insomnia, poor sleep, she is overwhelmed she has 2 children 17 and 19, and also moderate depression per depression screening scale total score 14 reviewed, I reviewed Dr. Keane Police examination which was normal including general goal examination, ears, neck thyroid, lymph nodes, skin, cardiovascular, lungs, abdomen, musculoskeletal, neurologic, diagnosed with migraine not intractable without status migrainosus.  Appears she is tried Topamax, Wellbutrin, sertraline, trazodone, melatonin,  Started in high school, started with auras, goes through spurts. She has neck pain and in the back of the head pain at the occiptio-cervico border, more tightness, rbbing temples helps, on occssion she can have a migraine with throbbing, light sensitivity, aura, nausea, topamax helps, now the frequency of these typoes of migraines has decreased maybe 1 a month or not, now she has a lot of tightness in the neck and pain, constant, brain fog, tingling and shooting. Never goes away. 6-7/10 right now. No relief but topamax helped with  the neck tightness. She has been to a Restaurant manager, fast food. She did get good relief with occipital nerve blocks temporarily but never lasted. Dry needling helped too but did not alleviate it. No weakness or radicular symptoms. She as not had any imaging of her neck. She has decreased range of motion. Chronic pain. She was driving down the road and had severe pain and couldn't think, she had a panic attack. No other focal neurologic deficits, associated symptoms, inciting events or modifiable factors.  Reviewed notes, labs and imaging from outside physicians, which showed:  CT head 02/19/2020 showed No acute intracranial abnormalities including mass lesion or mass effect, hydrocephalus, extra-axial fluid collection, midline shift, hemorrhage, or acute infarction, large ischemic events (personally reviewed images)  Review of Systems: Patient complains of symptoms per HPI as well as the following symptoms: neck pain. Pertinent negatives and positives per HPI. All others negative.   Social History   Socioeconomic History  . Marital status: Married    Spouse name: Not on file  . Number of children: Not on file  . Years of education: Not on file  . Highest education level: Not on file  Occupational History  . Not on file  Tobacco Use  . Smoking status: Never Smoker  . Smokeless tobacco: Never Used  Vaping Use  . Vaping Use: Never used  Substance and Sexual Activity  . Alcohol use: Yes    Comment: occasiona  . Drug use: No  . Sexual activity: Not on file  Other Topics Concern  . Not on file  Social History Narrative   Works at [employer].  Caffeine none.  Married, children 2.     Social Determinants of Health   Financial Resource Strain: Not on file  Food Insecurity: Not on file  Transportation Needs: Not on file  Physical Activity: Not on file  Stress: Not on file  Social Connections: Not on file  Intimate Partner Violence: Not on file    Family History  Problem Relation Age of Onset  .  Breast cancer Paternal Grandmother   . Fibromyalgia Mother   . Crohn's disease Father   . Anuerysm Sister     Past Medical History:  Diagnosis Date  . Encounter for insertion of mirena IUD   . History of acne   . Migraines   . Situational stress     Patient Active Problem List   Diagnosis Date Noted  . Migraine without aura and without status migrainosus, not intractable 03/02/2020  . Cervicalgia 03/02/2020  . Bilateral occipital neuralgia 03/02/2020    Past Surgical History:  Procedure Laterality Date  . ? compression fracture L4 in college due to fall    . TONSILLECTOMY AND ADENOIDECTOMY      Current Outpatient Medications  Medication Sig Dispense Refill  . ALPRAZolam (XANAX) 0.25 MG tablet Take 0.125-0.25 mg by mouth as needed for anxiety.    . B Complex-C (SUPER B-COMPLEX + VITAMIN C) TABS daily.    . cyclobenzaprine (FLEXERIL) 5 MG tablet Take 1 tablet (5 mg total) by mouth at bedtime. 30 tablet 1  . escitalopram (LEXAPRO) 5 MG tablet Take 5 mg by mouth daily.    Marland Kitchen levonorgestrel (MIRENA) 20 MCG/24HR IUD 1 each by Intrauterine route once.    Marland Kitchen levonorgestrel (MIRENA, 52 MG,) 20 MCG/24HR IUD Mirena 20 mcg/24 hours (7 yrs) 52 mg intrauterine device  Take 1 device every day by intrauterine route as directed.    Lita Mains 0.5-0.3 % SUSP Place 1 drop into both eyes.    . Magnesium 250 MG TABS daily.    . Melatonin 2.5 MG CHEW 1 HS     No current facility-administered medications for this visit.    Allergies as of 02/29/2020  . (No Known Allergies)    Vitals: BP 106/69   Pulse 74   Ht 5\' 6"  (1.676 m)   Wt 124 lb 9.6 oz (56.5 kg)   BMI 20.11 kg/m  Last Weight:  Wt Readings from Last 1 Encounters:  02/29/20 124 lb 9.6 oz (56.5 kg)   Last Height:   Ht Readings from Last 1 Encounters:  02/29/20 5\' 6"  (1.676 m)     Physical exam: Exam: Gen: NAD, conversant, well nourised, well groomed                     CV: RRR, no MRG. No Carotid Bruits.  No peripheral edema, warm, nontender Eyes: Conjunctivae clear without exudates or hemorrhage  Neuro: Detailed Neurologic Exam  Speech:    Speech is normal; fluent and spontaneous with normal comprehension.  Cognition:    The patient is oriented to person, place, and time;     recent and remote memory intact;     language fluent;     normal attention, concentration,     fund of knowledge Cranial Nerves:    The pupils are equal, round, and reactive to light. The fundi are normal and spontaneous venous pulsations are present. Visual fields are full to finger confrontation. Extraocular movements are intact. Trigeminal sensation is intact and the muscles of mastication are normal. The face is symmetric. The palate  elevates in the midline. Hearing intact. Voice is normal. Shoulder shrug is normal. The tongue has normal motion without fasciculations.   Coordination:    No dysmetria or ataxia  Gait:    Normal native gait  Motor Observation:    No asymmetry, no atrophy, and no involuntary movements noted. Tone:    Normal muscle tone.    Posture:    Posture is normal. normal erect    Strength:    Strength is V/V in the upper and lower limbs.      Sensation: intact to LT     Reflex Exam:  DTR's:    Deep tendon reflexes in the upper and lower extremities are normal bilaterally.   Toes:    The toes are downgoing bilaterally.   Clonus:    Clonus is absent.    Assessment/Plan:  45 year old here for neck pain. Occipital neuralgia vs atypical migraine or cervicogenic headache. We had a long discussion about options below in detail:   - Could try Ajovy in case atypical migraine(monthly injections) (she will think about it) - If cervicogenic try flexeril at bedtime(muscle relaxer), recommended PT at [facility] with dry needling, MRI cervical spine recommended (she will think about it) - If occipital neuralgia, and since she had good relief with occipital nerve blocks,may consider MRI  cervical spine, Epidural Steroid Injections or Radio frequency ablation of the nerve (more permanent procedure on the occipital nerve to make it less sensitive, done by send to Dr. Ernestina Patches)  Patient will call after she think about treatment options  Orders Placed This Encounter  Procedures  . MR CERVICAL SPINE WO CONTRAST  . CBC with Differential/Platelets  . Comprehensive metabolic panel  . TSH   Meds ordered this encounter  Medications  . cyclobenzaprine (FLEXERIL) 5 MG tablet    Sig: Take 1 tablet (5 mg total) by mouth at bedtime.    Dispense:  30 tablet    Refill:  1    Cc: Shon Baton, MD,  [place], [place] [facility] [place], [facility] [address] [place], [place] [address]  Phone [phone] Fax [phone]  I spent over minutes of face-to-face and non-face-to-face time with patient on the  1. Migraine without aura and without status migrainosus, not intractable   2. Cervicalgia   3. Bilateral occipital neuralgia   4. Cervico-occipital neuralgia   5. Chronic neck pain   6. Cervical radiculopathy   7. Osteoarthritis of spine with radiculopathy, cervical region    diagnosis.  This included previsit chart review, lab review, study review, order entry, electronic health record documentation, patient education on the different diagnostic and therapeutic options, counseling and coordination of care, risks and benefits of management, compliance, or risk factor reduction

## 2020-03-02 ENCOUNTER — Encounter: Payer: Self-pay | Admitting: Neurology

## 2020-03-02 DIAGNOSIS — G43009 Migraine without aura, not intractable, without status migrainosus: Secondary | ICD-10-CM | POA: Insufficient documentation

## 2020-03-02 DIAGNOSIS — M542 Cervicalgia: Secondary | ICD-10-CM | POA: Insufficient documentation

## 2020-03-02 DIAGNOSIS — M5481 Occipital neuralgia: Secondary | ICD-10-CM | POA: Insufficient documentation

## 2020-03-09 NOTE — Addendum Note (Signed)
Addended by: Sarina Ill B on: 03/09/2020 09:49 PM   Modules accepted: Orders

## 2020-03-10 ENCOUNTER — Telehealth: Payer: Self-pay | Admitting: Neurology

## 2020-03-10 DIAGNOSIS — G518 Other disorders of facial nerve: Secondary | ICD-10-CM | POA: Diagnosis not present

## 2020-03-10 DIAGNOSIS — M791 Myalgia, unspecified site: Secondary | ICD-10-CM | POA: Diagnosis not present

## 2020-03-10 DIAGNOSIS — G43119 Migraine with aura, intractable, without status migrainosus: Secondary | ICD-10-CM | POA: Diagnosis not present

## 2020-03-10 DIAGNOSIS — G43719 Chronic migraine without aura, intractable, without status migrainosus: Secondary | ICD-10-CM | POA: Diagnosis not present

## 2020-03-10 DIAGNOSIS — M542 Cervicalgia: Secondary | ICD-10-CM | POA: Diagnosis not present

## 2020-03-10 NOTE — Telephone Encounter (Signed)
LVM for pt to call back about scheduling mri  [employer] auth: [id number] (exp. 03/10/20 to 04/08/20)

## 2020-03-11 NOTE — Telephone Encounter (Signed)
Patient returned my call se is scheduled at [facility] for 03/26/20.

## 2020-03-13 DIAGNOSIS — M9901 Segmental and somatic dysfunction of cervical region: Secondary | ICD-10-CM | POA: Diagnosis not present

## 2020-03-13 DIAGNOSIS — M9900 Segmental and somatic dysfunction of head region: Secondary | ICD-10-CM | POA: Diagnosis not present

## 2020-03-13 DIAGNOSIS — S46012A Strain of muscle(s) and tendon(s) of the rotator cuff of left shoulder, initial encounter: Secondary | ICD-10-CM | POA: Diagnosis not present

## 2020-03-13 DIAGNOSIS — M5382 Other specified dorsopathies, cervical region: Secondary | ICD-10-CM | POA: Diagnosis not present

## 2020-03-13 DIAGNOSIS — M9902 Segmental and somatic dysfunction of thoracic region: Secondary | ICD-10-CM | POA: Diagnosis not present

## 2020-03-13 DIAGNOSIS — G4489 Other headache syndrome: Secondary | ICD-10-CM | POA: Diagnosis not present

## 2020-03-20 DIAGNOSIS — H5310 Unspecified subjective visual disturbances: Secondary | ICD-10-CM | POA: Diagnosis not present

## 2020-03-25 ENCOUNTER — Other Ambulatory Visit: Payer: Self-pay

## 2020-03-25 ENCOUNTER — Ambulatory Visit
Admission: RE | Admit: 2020-03-25 | Discharge: 2020-03-25 | Disposition: A | Discharge status: Home / Self Care | Payer: BC Managed Care – PPO | Source: Ambulatory Visit | Attending: Obstetrics and Gynecology | Admitting: Obstetrics and Gynecology

## 2020-03-25 DIAGNOSIS — R928 Other abnormal and inconclusive findings on diagnostic imaging of breast: Secondary | ICD-10-CM

## 2020-03-25 DIAGNOSIS — N6311 Unspecified lump in the right breast, upper outer quadrant: Secondary | ICD-10-CM | POA: Diagnosis not present

## 2020-03-25 DIAGNOSIS — R922 Inconclusive mammogram: Secondary | ICD-10-CM | POA: Diagnosis not present

## 2020-03-26 ENCOUNTER — Ambulatory Visit: Payer: BC Managed Care – PPO

## 2020-03-26 DIAGNOSIS — M5412 Radiculopathy, cervical region: Secondary | ICD-10-CM

## 2020-03-26 DIAGNOSIS — M5481 Occipital neuralgia: Secondary | ICD-10-CM | POA: Diagnosis not present

## 2020-03-26 DIAGNOSIS — M542 Cervicalgia: Secondary | ICD-10-CM

## 2020-03-26 DIAGNOSIS — M4722 Other spondylosis with radiculopathy, cervical region: Secondary | ICD-10-CM

## 2020-03-26 DIAGNOSIS — G8929 Other chronic pain: Secondary | ICD-10-CM

## 2020-03-27 DIAGNOSIS — M5382 Other specified dorsopathies, cervical region: Secondary | ICD-10-CM | POA: Diagnosis not present

## 2020-03-27 DIAGNOSIS — M9902 Segmental and somatic dysfunction of thoracic region: Secondary | ICD-10-CM | POA: Diagnosis not present

## 2020-03-27 DIAGNOSIS — M9900 Segmental and somatic dysfunction of head region: Secondary | ICD-10-CM | POA: Diagnosis not present

## 2020-03-27 DIAGNOSIS — M9901 Segmental and somatic dysfunction of cervical region: Secondary | ICD-10-CM | POA: Diagnosis not present

## 2020-03-27 DIAGNOSIS — S46012A Strain of muscle(s) and tendon(s) of the rotator cuff of left shoulder, initial encounter: Secondary | ICD-10-CM | POA: Diagnosis not present

## 2020-04-01 DIAGNOSIS — Z124 Encounter for screening for malignant neoplasm of cervix: Secondary | ICD-10-CM | POA: Diagnosis not present

## 2020-04-01 DIAGNOSIS — Z01419 Encounter for gynecological examination (general) (routine) without abnormal findings: Secondary | ICD-10-CM | POA: Diagnosis not present

## 2020-04-01 DIAGNOSIS — Z6820 Body mass index (BMI) 20.0-20.9, adult: Secondary | ICD-10-CM | POA: Diagnosis not present

## 2020-04-01 DIAGNOSIS — G4700 Insomnia, unspecified: Secondary | ICD-10-CM | POA: Diagnosis not present

## 2020-04-09 DIAGNOSIS — M5382 Other specified dorsopathies, cervical region: Secondary | ICD-10-CM | POA: Diagnosis not present

## 2020-04-09 DIAGNOSIS — G4489 Other headache syndrome: Secondary | ICD-10-CM | POA: Diagnosis not present

## 2020-04-09 DIAGNOSIS — M9902 Segmental and somatic dysfunction of thoracic region: Secondary | ICD-10-CM | POA: Diagnosis not present

## 2020-04-09 DIAGNOSIS — S46012A Strain of muscle(s) and tendon(s) of the rotator cuff of left shoulder, initial encounter: Secondary | ICD-10-CM | POA: Diagnosis not present

## 2020-04-09 DIAGNOSIS — M9900 Segmental and somatic dysfunction of head region: Secondary | ICD-10-CM | POA: Diagnosis not present

## 2020-04-09 DIAGNOSIS — M9901 Segmental and somatic dysfunction of cervical region: Secondary | ICD-10-CM | POA: Diagnosis not present

## 2020-04-21 ENCOUNTER — Ambulatory Visit: Payer: BC Managed Care – PPO | Admitting: Neurology

## 2020-04-21 DIAGNOSIS — M5481 Occipital neuralgia: Secondary | ICD-10-CM

## 2020-04-21 MED ORDER — METHYLPREDNISOLONE ACETATE 80 MG/ML IJ SUSP
80.0000 mg | Freq: Once | INTRAMUSCULAR | Status: AC
Start: 2020-04-21 — End: 2020-04-21
  Administered 2020-04-21: 80 mg via INTRAMUSCULAR

## 2020-04-21 NOTE — Progress Notes (Signed)
Nerve block w/ steroid: Pt signed consent  0.5% Bupivocaine 2.5 mL LOT: [id number] EXP: 11/2021 NDC: 75170-017-49  2% Lidocaine 2.5 mL LOT: [id number] EXP: 05/2021 NDC: 16384-665-99  Depomedrol 80 mg/mL x 1  See MAR

## 2020-04-21 NOTE — Progress Notes (Signed)
[facility]    Provider:  Dr Jaynee Eagles Requesting Provider: Shon Baton, MD Primary Care Provider:  Shon Baton, MD  CC:  Migraines  Her father has PD, she feels internal trmors, she has a postural tremor, discussed that is not the tremor we look for in PD, tried to reassure her. She is feeling better with chiropractic therapy for her neck and also wearing a soft collar at night to help with neck positioning. We performed nerve blocks today. We will see if she improves, discussed results are variable.  Reviewed Images together of MRI cervical spine, answered all questions  MRI cervical spine: 03/26/2020:   MRI cervical spine without contrast demonstrating: - At C5-6: disc bulging and spondylosis with slight contact upon ventral spinal cord, and moderate right foraminal stenosis; no cord signal changes.  HPI:  Rachael Garza is a 45 y.o. female here as requested by Shon Baton, MD for 2nd opinion on migraines.  She has a past medical history of migraine, severe anxiety.  I reviewed Dr. Keane Police notes, patient has a history of headaches, migraines off and on, no more tension and neck tightness, she has been to chiropractor, she has been evaluated and treated by [facility], she was placed on Topamax and went to 75 mg and then had side effects which included brain fog and out of body experiences, had an episode of panic attack for a few hours could not breathe cannot drink, no focal neurodeficits, she came off of Topamax slowly, saw Dr. Domingo Cocking who thought it was more likely dissociation, she has insomnia, poor sleep, she is overwhelmed she has 2 children 17 and 19, and also moderate depression per depression screening scale total score 14 reviewed, I reviewed Dr. Keane Police examination which was normal including general goal examination, ears, neck thyroid, lymph nodes, skin, cardiovascular, lungs, abdomen, musculoskeletal, neurologic, diagnosed with  migraine not intractable without status migrainosus.  Appears she is tried Topamax, Wellbutrin, sertraline, trazodone, melatonin,  Started in high school, started with auras, goes through spurts. She has neck pain and in the back of the head pain at the occiptio-cervico border, more tightness, rbbing temples helps, on occssion she can have a migraine with throbbing, light sensitivity, aura, nausea, topamax helps, now the frequency of these typoes of migraines has decreased maybe 1 a month or not, now she has a lot of tightness in the neck and pain, constant, brain fog, tingling and shooting. Never goes away. 6-7/10 right now. No relief but topamax helped with the neck tightness. She has been to a Restaurant manager, fast food. She did get good relief with occipital nerve blocks temporarily but never lasted. Dry needling helped too but did not alleviate it. No weakness or radicular symptoms. She as not had any imaging of her neck. She has decreased range of motion. Chronic pain. She was driving down the road and had severe pain and couldn't think, she had a panic attack. No other focal neurologic deficits, associated symptoms, inciting events or modifiable factors.  Reviewed notes, labs and imaging from outside physicians, which showed:  CT head 02/19/2020 showed No acute intracranial abnormalities including mass lesion or mass effect, hydrocephalus, extra-axial fluid collection, midline shift, hemorrhage, or acute infarction, large ischemic events (personally reviewed images)  Review of Systems: Patient complains of symptoms per HPI as well as the following symptoms: neck pain. Pertinent negatives and positives per HPI. All others negative.   Social History   Socioeconomic History  . Marital status: Married  Spouse name: Not on file  . Number of children: Not on file  . Years of education: Not on file  . Highest education level: Not on file  Occupational History  . Not on file  Tobacco Use  . Smoking status:  Never Smoker  . Smokeless tobacco: Never Used  Vaping Use  . Vaping Use: Never used  Substance and Sexual Activity  . Alcohol use: Yes    Comment: occasiona  . Drug use: No  . Sexual activity: Not on file  Other Topics Concern  . Not on file  Social History Narrative   Works at [employer].  Caffeine none.  Married, children 2.     Social Determinants of Health   Financial Resource Strain: Not on file  Food Insecurity: Not on file  Transportation Needs: Not on file  Physical Activity: Not on file  Stress: Not on file  Social Connections: Not on file  Intimate Partner Violence: Not on file    Family History  Problem Relation Age of Onset  . Breast cancer Paternal Grandmother   . Fibromyalgia Mother   . Crohn's disease Father   . Anuerysm Sister     Past Medical History:  Diagnosis Date  . Encounter for insertion of mirena IUD   . History of acne   . Migraines   . Situational stress     Patient Active Problem List   Diagnosis Date Noted  . Migraine without aura and without status migrainosus, not intractable 03/02/2020  . Cervicalgia 03/02/2020  . Bilateral occipital neuralgia 03/02/2020    Past Surgical History:  Procedure Laterality Date  . ? compression fracture L4 in college due to fall    . TONSILLECTOMY AND ADENOIDECTOMY      Current Outpatient Medications  Medication Sig Dispense Refill  . ALPRAZolam (XANAX) 0.25 MG tablet Take 0.125-0.25 mg by mouth as needed for anxiety.    . B Complex-C (SUPER B-COMPLEX + VITAMIN C) TABS daily.    . cyclobenzaprine (FLEXERIL) 5 MG tablet Take 1 tablet (5 mg total) by mouth at bedtime. 30 tablet 1  . escitalopram (LEXAPRO) 5 MG tablet Take 5 mg by mouth daily.    Marland Kitchen levonorgestrel (MIRENA) 20 MCG/24HR IUD 1 each by Intrauterine route once.    Marland Kitchen levonorgestrel (MIRENA, 52 MG,) 20 MCG/24HR IUD Mirena 20 mcg/24 hours (7 yrs) 52 mg intrauterine device  Take 1 device every day by intrauterine route as directed.    Lita Mains 0.5-0.3 % SUSP Place 1 drop into both eyes.    . Magnesium 250 MG TABS daily.    . Melatonin 2.5 MG CHEW 1 HS     No current facility-administered medications for this visit.    Allergies as of 04/21/2020  . (No Known Allergies)    Vitals: There were no vitals taken for this visit. Last Weight:  Wt Readings from Last 1 Encounters:  02/29/20 124 lb 9.6 oz (56.5 kg)   Last Height:   Ht Readings from Last 1 Encounters:  02/29/20 5\' 6"  (1.676 m)     Physical exam: Exam: Gen: NAD, conversant, well nourised, well groomed                     CV: RRR, no MRG. No Carotid Bruits. No peripheral edema, warm, nontender Eyes: Conjunctivae clear without exudates or hemorrhage  Neuro: Detailed Neurologic Exam  Speech:    Speech is normal; fluent and spontaneous with normal comprehension.  Cognition:  The patient is oriented to person, place, and time;     recent and remote memory intact;     language fluent;     normal attention, concentration,     fund of knowledge Cranial Nerves:    The pupils are equal, round, and reactive to light. The fundi are normal and spontaneous venous pulsations are present. Visual fields are full to finger confrontation. Extraocular movements are intact. Trigeminal sensation is intact and the muscles of mastication are normal. The face is symmetric. The palate elevates in the midline. Hearing intact. Voice is normal. Shoulder shrug is normal. The tongue has normal motion without fasciculations.   Coordination:    No dysmetria or ataxia  Gait:    Normal native gait  Motor Observation:    No asymmetry, no atrophy, and no involuntary movements noted. Tone:    Normal muscle tone.    Posture:    Posture is normal. normal erect    Strength:    Strength is V/V in the upper and lower limbs.      Sensation: intact to LT     Reflex Exam:  DTR's:    Deep tendon reflexes in the upper and lower extremities are normal  bilaterally.   Toes:    The toes are downgoing bilaterally.   Clonus:    Clonus is absent.    Assessment/Plan:  45 year old here for neck pain. Occipital neuralgia vs atypical migraine or cervicogenic headache. We had a long discussion about options below in detail:   Nerve blocks today (bill by time) Reviewed Images together of MRI cervical spine, answered all questions  Performed by Dr. Jaynee Eagles M.D. All procedures a documented blood were medically necessary, reasonable and appropriate based on the patient's history, medical diagnosis and physician opinion. Verbal informed consent was obtained from the patient, patient was informed of potential risk of procedure, including bruising, bleeding, hematoma formation, infection, muscle weakness, muscle pain, numbness, transient hypertension, transient hyperglycemia and transient insomnia among others. All areas injected were topically clean with isopropyl rubbing alcohol. Nonsterile nonlatex gloves were worn during the procedure.  1. Greater occipital nerve block [phone]). The greater occipital nerve site was identified at the nuchal line medial to the occipital artery. Medication was injected into the left and right occipital nerve areas and suboccipital areas. Patient's condition is associated with inflammation of the greater occipital nerve and associated multiple groups. Injection was deemed medically necessary, reasonable and appropriate. Injection represents a separate and unique surgical service.  PRIOR - Could try Ajovy in case atypical migraine(monthly injections) (she will think about it) - If cervicogenic try flexeril at bedtime(muscle relaxer), recommended PT at [facility] with dry needling, MRI cervical spine recommended (she will think about it) - If occipital neuralgia, and since she had good relief with occipital nerve blocks,may consider MRI cervical spine, Epidural Steroid Injections or Radio frequency ablation of the nerve (more  permanent procedure on the occipital nerve to make it less sensitive, done by send to Dr. Ernestina Patches)  Patient will call after she think about treatment options  No orders of the defined types were placed in this encounter.  Meds ordered this encounter  Medications  . methylPREDNISolone acetate (DEPO-MEDROL) injection 80 mg    Cc: Shon Baton, MD,  Shon Baton, MD  Sarina Ill, MD  [facility] [address] [place], [place] [address]  Phone [phone] Fax [phone]  I spent 30 minutes of face-to-face and non-face-to-face time with patient on the  1.  Bilateral occipital neuralgia    diagnosis.  This included previsit chart review, lab review, study review, order entry, electronic health record documentation, patient education on the different diagnostic and therapeutic options, counseling and coordination of care, risks and benefits of management, compliance, or risk factor reduction

## 2020-04-22 DIAGNOSIS — M9901 Segmental and somatic dysfunction of cervical region: Secondary | ICD-10-CM | POA: Diagnosis not present

## 2020-04-22 DIAGNOSIS — M9902 Segmental and somatic dysfunction of thoracic region: Secondary | ICD-10-CM | POA: Diagnosis not present

## 2020-04-22 DIAGNOSIS — M5382 Other specified dorsopathies, cervical region: Secondary | ICD-10-CM | POA: Diagnosis not present

## 2020-04-22 DIAGNOSIS — G4489 Other headache syndrome: Secondary | ICD-10-CM | POA: Diagnosis not present

## 2020-04-22 DIAGNOSIS — M9900 Segmental and somatic dysfunction of head region: Secondary | ICD-10-CM | POA: Diagnosis not present

## 2020-06-09 DIAGNOSIS — M62838 Other muscle spasm: Secondary | ICD-10-CM | POA: Diagnosis not present

## 2020-06-09 DIAGNOSIS — M5382 Other specified dorsopathies, cervical region: Secondary | ICD-10-CM | POA: Diagnosis not present

## 2020-06-09 DIAGNOSIS — G4489 Other headache syndrome: Secondary | ICD-10-CM | POA: Diagnosis not present

## 2020-06-09 DIAGNOSIS — M9900 Segmental and somatic dysfunction of head region: Secondary | ICD-10-CM | POA: Diagnosis not present

## 2020-06-09 DIAGNOSIS — M9901 Segmental and somatic dysfunction of cervical region: Secondary | ICD-10-CM | POA: Diagnosis not present

## 2020-06-10 ENCOUNTER — Encounter: Payer: BC Managed Care – PPO | Admitting: Physical Therapy

## 2020-06-10 ENCOUNTER — Other Ambulatory Visit: Payer: Self-pay

## 2020-06-10 ENCOUNTER — Encounter: Payer: Self-pay | Admitting: Physical Therapy

## 2020-06-10 ENCOUNTER — Ambulatory Visit: Payer: BC Managed Care – PPO | Attending: Internal Medicine | Admitting: Physical Therapy

## 2020-06-10 DIAGNOSIS — H8112 Benign paroxysmal vertigo, left ear: Secondary | ICD-10-CM | POA: Insufficient documentation

## 2020-06-10 DIAGNOSIS — R42 Dizziness and giddiness: Secondary | ICD-10-CM | POA: Diagnosis not present

## 2020-06-10 NOTE — Therapy (Signed)
[facility] [address] [place], [place], [address] Phone: [phone]   Fax:  [phone]  Physical Therapy Evaluation  Patient Details  Name: Rachael Garza MRN: [id number] Date of Birth: Nov 29, 1975 Referring Provider (PT): Shon Baton, MD   Encounter Date: 06/10/2020   PT End of Session - 06/10/20 2008    Visit Number 1    Number of Visits 4    Date for PT Re-Evaluation 07/10/20    Authorization Type BCBS; $40 copay    PT Start Time 1325   arrived late   PT Stop Time 1400    PT Time Calculation (min) 35 min    Activity Tolerance Patient tolerated treatment well    Behavior During Therapy [facility] for tasks assessed/performed           Past Medical History:  Diagnosis Date  . Encounter for insertion of mirena IUD   . History of acne   . Migraines   . Situational stress     Past Surgical History:  Procedure Laterality Date  . ? compression fracture L4 in college due to fall    . TONSILLECTOMY AND ADENOIDECTOMY      There were no vitals filed for this visit.    Subjective Assessment - 06/10/20 1326    Subjective 3 weeks ago woke up with severe dizziness; lasted all day standing and walking - no relieving factors except lying down.  Has had vertigo in the past but it was usually it was positional - treated with Epley maneuver.  Dizziness has improved but still having symptoms when she first gets up in the morning - subsides as the day progresses.  Does having some symptoms in L sidelying.  Denies nausea or vomiting; reports reduced appetite.  Pt reports some blurry vision but thinks that began after starting new medicine in January.  Denies changes in hearing.  Is going to a chiropractor for neck pain.  Intermittently having headaches but is better since treating her neck pain.  Denies any recent infections or head trauma.    Pertinent History anxiety, HA, migraine, cervicalgia, occipital neuralgia, insomnia,  vertigo    Diagnostic tests CT in January, MRI of neck in February    Patient Stated Goals To make the dizziness go away    Currently in Pain? Yes    Pain Location Neck    Pain Descriptors / Indicators Tightness    Pain Type Chronic pain              [facility] PT Assessment - 06/10/20 1334      Assessment   Medical Diagnosis Dizziness    Referring Provider (PT) Shon Baton, MD    Onset Date/Surgical Date 06/06/20    Prior Therapy yes      Precautions   Precautions Other (comment)    Precaution Comments anxiety, HA, migraine, cervicalgia, occipital neuralgia, insomnia, vertigo      Balance Screen   Has the patient fallen in the past 6 months No      [place] residence      Prior Function   Level of Independence Independent    Vocation Full time employment    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      Observation/Other Assessments   Focus on Therapeutic Outcomes (FOTO)  Dizziness Positional Status: 53; Dizziness Functional Status: 56.3      Sensation   Light Touch Appears Intact  ROM / Strength   AROM / PROM / Strength Strength                  Vestibular Assessment - 06/10/20 1337      Vestibular Assessment   General Observation ambulates without difficulty      Symptom Behavior   Subjective history of current problem --    Type of Dizziness  Spinning;Comment   swaying motion when standing still   Frequency of Dizziness daily    Duration of Dizziness seconds    Symptom Nature Motion provoked;Positional    Aggravating Factors Lying supine;Looking up to the ceiling;Supine to sit;Forward bending;Rolling to left    Relieving Factors Head stationary    Progression of Symptoms Better    History of similar episodes vertigo in 2017/2018 treated effectively with Epley      Oculomotor Exam   Oculomotor Alignment Normal    Ocular ROM WFL    Spontaneous Absent    Gaze-induced  Absent    Smooth  Pursuits Intact    Saccades Intact      Oculomotor Exam-Fixation Suppressed    Left Head Impulse negative    Right Head Impulse positive for small refixation saccade      Vestibulo-Ocular Reflex   VOR to Slow Head Movement Normal    VOR Cancellation Normal      Positional Testing   Dix-Hallpike Dix-Hallpike Right;Dix-Hallpike Left    Horizontal Canal Testing Horizontal Canal Right;Horizontal Canal Left      Dix-Hallpike Right   Dix-Hallpike Right Duration 0    Dix-Hallpike Right Symptoms No nystagmus      Dix-Hallpike Left   Dix-Hallpike Left Duration 10 seconds    Dix-Hallpike Left Symptoms Upbeat, left rotatory nystagmus      Horizontal Canal Right   Horizontal Canal Right Duration 0    Horizontal Canal Right Symptoms Normal      Horizontal Canal Left   Horizontal Canal Left Duration 0    Horizontal Canal Left Symptoms Normal              Objective measurements completed on examination: See above findings.        Vestibular Treatment/Exercise - 06/10/20 1353      Vestibular Treatment/Exercise   Vestibular Treatment Provided Canalith Repositioning    Canalith Repositioning Epley Manuever Left       EPLEY MANUEVER LEFT   Number of Reps  1    Overall Response  Symptoms Resolved     RESPONSE DETAILS LEFT pt reported feeling woozy, nauseous and hot after performing treatment.  Provided pt with cool wash cloth and prolonged sitting rest break to allow symptoms to settle                 PT Education - 06/10/20 2008    Education Details clinical findings, PT POC and goals    Person(s) Educated Patient    Methods Explanation    Comprehension Verbalized understanding               PT Long Term Goals - 06/10/20 2014      PT LONG TERM GOAL #1   Title Pt will demonstrate negative positional testing for all L/R peripheral canals    Baseline L BPPV    Time 4    Period Weeks    Status New    Target Date 07/10/20      PT LONG TERM GOAL #2    Title Pt will report no dizziness with supine <>  sit, rolling to L, looking up at the ceiling or bending down to the floor    Time 4    Period Weeks    Status New    Target Date 07/10/20      PT LONG TERM GOAL #3   Title Pt will report increase on FOTO: DPS >/= 63; DFS >5 points    Baseline DPS: 53, DFS: 56.3    Time 4    Period Weeks    Status New    Target Date 07/10/20      PT LONG TERM GOAL #4   Title Pt will demonstrate ability to perform effective self-CRM for home    Time 4    Period Weeks    Status New    Target Date 07/10/20                  Plan - 06/10/20 2009    Clinical Impression Statement Pt is a 45 year old female referred to [facility] for evaluation of vertigo.  Pt's PMH is significant for the following: anxiety, HA, migraine, cervicalgia, occipital neuralgia, insomnia, vertigo. The following deficits were noted during pt's exam: vertigo and upbeating, L rotary nystagmus of short duration during L dix-hallpike indicating L posterior canal canalithiasis; treated x1 with CRM with resolution of symptoms.  Pt experienced similar symptoms and response to treatment 4-5 years ago.  Pt would benefit from skilled PT to address these impairments and functional limitations to maximize functional mobility independence and reduce falls risk.    Personal Factors and Comorbidities Comorbidity 3+;Past/Current Experience    Comorbidities anxiety, HA, migraine, cervicalgia, occipital neuralgia, insomnia, vertigo    Examination-Activity Limitations Bed Mobility;Bend;Reach Overhead    Stability/Clinical Decision Making Stable/Uncomplicated    Clinical Decision Making Low    Rehab Potential Good    PT Frequency 1x / week    PT Duration 4 weeks    PT Treatment/Interventions Canalith Repostioning;ADLs/Self Care Home Management;Therapeutic activities;Therapeutic exercise;Balance training;Neuromuscular re-education;Patient/family education;Vestibular    PT Next Visit Plan re-assess  for L BPPV and treat if indicated; teach home maneuver.  When symptoms cleared, reassess FOTO and D/C    Consulted and Agree with Plan of Care Patient           Patient will benefit from skilled therapeutic intervention in order to improve the following deficits and impairments:  Dizziness,Decreased balance  Visit Diagnosis: BPPV (benign paroxysmal positional vertigo), left  Dizziness and giddiness     Problem List Patient Active Problem List   Diagnosis Date Noted  . Migraine without aura and without status migrainosus, not intractable 03/02/2020  . Cervicalgia 03/02/2020  . Bilateral occipital neuralgia 03/02/2020    Rico Junker, PT, DPT 06/10/20    8:18 PM    [facility] [address] [place], [place], [address] Phone: [phone]   Fax:  [phone]  Name: Rachael Garza MRN: [id number] Date of Birth: 15-Jul-1975

## 2020-06-17 ENCOUNTER — Ambulatory Visit: Payer: BC Managed Care – PPO

## 2020-06-23 ENCOUNTER — Ambulatory Visit: Payer: BC Managed Care – PPO | Admitting: Physical Therapy

## 2020-07-01 ENCOUNTER — Other Ambulatory Visit: Payer: Self-pay

## 2020-07-01 ENCOUNTER — Ambulatory Visit: Payer: BC Managed Care – PPO

## 2020-07-01 VITALS — BP 112/78 | HR 86

## 2020-07-01 DIAGNOSIS — H8112 Benign paroxysmal vertigo, left ear: Secondary | ICD-10-CM | POA: Diagnosis not present

## 2020-07-01 DIAGNOSIS — R42 Dizziness and giddiness: Secondary | ICD-10-CM

## 2020-07-01 NOTE — Therapy (Signed)
[facility] [address] [place], [place], [address] Phone: [phone]   Fax:  [phone]  Physical Therapy Treatment  Patient Details  Name: Rachael Garza MRN: [id number] Date of Birth: 11/04/1975 Referring Provider (PT): Shon Baton, MD   Encounter Date: 07/01/2020   PT End of Session - 07/01/20 1534    Visit Number 2    Number of Visits 4    Date for PT Re-Evaluation 07/10/20    Authorization Type BCBS; $40 copay    PT Start Time 1532    PT Stop Time 1601    PT Time Calculation (min) 29 min    Activity Tolerance Patient tolerated treatment well    Behavior During Therapy [facility] for tasks assessed/performed           Past Medical History:  Diagnosis Date  . Encounter for insertion of mirena IUD   . History of acne   . Migraines   . Situational stress     Past Surgical History:  Procedure Laterality Date  . ? compression fracture L4 in college due to fall    . TONSILLECTOMY AND ADENOIDECTOMY      Vitals:   07/01/20 1538 07/01/20 1540  BP: 100/67 112/78  Pulse: 78 86     Subjective Assessment - 07/01/20 1534    Subjective Patient reports dizziness is better, still feels it but has improved. No other new changes/complaints. Reports blurry vision is about the same.    Pertinent History anxiety, HA, migraine, cervicalgia, occipital neuralgia, insomnia, vertigo    Diagnostic tests CT in January, MRI of neck in February    Patient Stated Goals To make the dizziness go away    Currently in Pain? No/denies                   Vestibular Assessment - 07/01/20 0001      Positional Testing   Dix-Hallpike Dix-Hallpike Left      Dix-Hallpike Left   Dix-Hallpike Left Duration 8 seconds    Dix-Hallpike Left Symptoms Upbeat, left rotatory nystagmus              Vestibular Treatment/Exercise - 07/01/20 0001      Vestibular Treatment/Exercise   Vestibular Treatment Provided Canalith  Repositioning    Canalith Repositioning Epley Manuever Left       EPLEY MANUEVER LEFT   Number of Reps  2    Overall Response  Symptoms Resolved     RESPONSE DETAILS LEFT no nystagmus/symptoms upon reassesment               PT Education - 07/01/20 1604    Education Details schedule 1 more visit to ensure resolution of BPPV    Person(s) Educated Patient    Methods Explanation    Comprehension Verbalized understanding               PT Long Term Goals - 06/10/20 2014      PT LONG TERM GOAL #1   Title Pt will demonstrate negative positional testing for all L/R peripheral canals    Baseline L BPPV    Time 4    Period Weeks    Status New    Target Date 07/10/20      PT LONG TERM GOAL #2   Title Pt will report no dizziness with supine <> sit, rolling to L, looking up at the ceiling or bending down to the floor    Time 4    Period Weeks  Status New    Target Date 07/10/20      PT LONG TERM GOAL #3   Title Pt will report increase on FOTO: DPS >/= 63; DFS >5 points    Baseline DPS: 53, DFS: 56.3    Time 4    Period Weeks    Status New    Target Date 07/10/20      PT LONG TERM GOAL #4   Title Pt will demonstrate ability to perform effective self-CRM for home    Time 4    Period Weeks    Status New    Target Date 07/10/20                 Plan - 07/01/20 1605    Clinical Impression Statement Reassesment with positional testing, patient demo upbeating L Rotary nystagmus of short duration indicated continued L Posterior canal canalithiasis. Completed treatment x 2 with CRM with resolution of symptoms noted. PT educating on to schedule one more visit to ensure resolution due and to be educated on home manuever prior to d/c.    Personal Factors and Comorbidities Comorbidity 3+;Past/Current Experience    Comorbidities anxiety, HA, migraine, cervicalgia, occipital neuralgia, insomnia, vertigo    Examination-Activity Limitations Bed Mobility;Bend;Reach Overhead     Stability/Clinical Decision Making Stable/Uncomplicated    Rehab Potential Good    PT Frequency 1x / week    PT Duration 4 weeks    PT Treatment/Interventions Canalith Repostioning;ADLs/Self Care Home Management;Therapeutic activities;Therapeutic exercise;Balance training;Neuromuscular re-education;Patient/family education;Vestibular    PT Next Visit Plan re-assess for L BPPV and treat if indicated; teach home maneuver.  When symptoms cleared, reassess FOTO and D/C    Consulted and Agree with Plan of Care Patient           Patient will benefit from skilled therapeutic intervention in order to improve the following deficits and impairments:  Dizziness,Decreased balance  Visit Diagnosis: BPPV (benign paroxysmal positional vertigo), left  Dizziness and giddiness     Problem List Patient Active Problem List   Diagnosis Date Noted  . Migraine without aura and without status migrainosus, not intractable 03/02/2020  . Cervicalgia 03/02/2020  . Bilateral occipital neuralgia 03/02/2020    Jones Bales, PT, DPT 07/01/2020, 4:07 PM  [facility] [address] [place], [place], [address] Phone: [phone]   Fax:  [phone]  Name: Rachael Garza MRN: [id number] Date of Birth: 12-28-1975

## 2020-07-10 DIAGNOSIS — R42 Dizziness and giddiness: Secondary | ICD-10-CM | POA: Diagnosis not present

## 2020-07-10 DIAGNOSIS — H8112 Benign paroxysmal vertigo, left ear: Secondary | ICD-10-CM | POA: Diagnosis not present

## 2020-07-14 DIAGNOSIS — N6320 Unspecified lump in the left breast, unspecified quadrant: Secondary | ICD-10-CM | POA: Diagnosis not present

## 2020-07-17 ENCOUNTER — Other Ambulatory Visit: Payer: Self-pay | Admitting: Nurse Practitioner

## 2020-07-17 DIAGNOSIS — N6320 Unspecified lump in the left breast, unspecified quadrant: Secondary | ICD-10-CM

## 2020-07-18 ENCOUNTER — Ambulatory Visit: Payer: BC Managed Care – PPO

## 2020-07-23 ENCOUNTER — Other Ambulatory Visit: Payer: Self-pay | Admitting: Nurse Practitioner

## 2020-07-23 ENCOUNTER — Ambulatory Visit
Admission: RE | Admit: 2020-07-23 | Discharge: 2020-07-23 | Disposition: A | Discharge status: Home / Self Care | Payer: BC Managed Care – PPO | Source: Ambulatory Visit | Attending: Nurse Practitioner | Admitting: Nurse Practitioner

## 2020-07-23 ENCOUNTER — Other Ambulatory Visit: Payer: Self-pay

## 2020-07-23 DIAGNOSIS — N6320 Unspecified lump in the left breast, unspecified quadrant: Secondary | ICD-10-CM

## 2020-07-23 DIAGNOSIS — N6489 Other specified disorders of breast: Secondary | ICD-10-CM | POA: Diagnosis not present

## 2020-07-23 DIAGNOSIS — R922 Inconclusive mammogram: Secondary | ICD-10-CM | POA: Diagnosis not present

## 2020-07-24 ENCOUNTER — Other Ambulatory Visit: Payer: BC Managed Care – PPO

## 2020-07-28 ENCOUNTER — Other Ambulatory Visit: Payer: Self-pay | Admitting: Obstetrics and Gynecology

## 2020-07-28 DIAGNOSIS — N6320 Unspecified lump in the left breast, unspecified quadrant: Secondary | ICD-10-CM

## 2020-07-29 ENCOUNTER — Ambulatory Visit: Payer: BC Managed Care – PPO

## 2020-07-29 ENCOUNTER — Other Ambulatory Visit: Payer: Self-pay | Admitting: Obstetrics and Gynecology

## 2020-07-29 ENCOUNTER — Other Ambulatory Visit: Payer: Self-pay

## 2020-07-29 ENCOUNTER — Ambulatory Visit
Admission: RE | Admit: 2020-07-29 | Discharge: 2020-07-29 | Disposition: A | Discharge status: Home / Self Care | Payer: BC Managed Care – PPO | Source: Ambulatory Visit | Attending: Obstetrics and Gynecology | Admitting: Obstetrics and Gynecology

## 2020-07-29 ENCOUNTER — Ambulatory Visit
Admission: RE | Admit: 2020-07-29 | Discharge: 2020-07-29 | Disposition: A | Discharge status: Home / Self Care | Payer: BC Managed Care – PPO | Source: Ambulatory Visit | Attending: Nurse Practitioner | Admitting: Nurse Practitioner

## 2020-07-29 DIAGNOSIS — C50412 Malignant neoplasm of upper-outer quadrant of left female breast: Secondary | ICD-10-CM | POA: Diagnosis not present

## 2020-07-29 DIAGNOSIS — N6320 Unspecified lump in the left breast, unspecified quadrant: Secondary | ICD-10-CM

## 2020-07-30 ENCOUNTER — Other Ambulatory Visit: Payer: Self-pay | Admitting: Nurse Practitioner

## 2020-07-30 DIAGNOSIS — Z853 Personal history of malignant neoplasm of breast: Secondary | ICD-10-CM

## 2020-07-31 ENCOUNTER — Telehealth: Payer: Self-pay | Admitting: Hematology and Oncology

## 2020-07-31 NOTE — Telephone Encounter (Signed)
Spoke with patient to confirm afternoon appointment, packet will be emailed to patient

## 2020-08-01 ENCOUNTER — Encounter: Payer: Self-pay | Admitting: *Deleted

## 2020-08-01 DIAGNOSIS — Z170 Estrogen receptor positive status [ER+]: Secondary | ICD-10-CM

## 2020-08-01 DIAGNOSIS — C50412 Malignant neoplasm of upper-outer quadrant of left female breast: Secondary | ICD-10-CM

## 2020-08-03 ENCOUNTER — Other Ambulatory Visit: Payer: Self-pay

## 2020-08-03 ENCOUNTER — Ambulatory Visit
Admission: RE | Admit: 2020-08-03 | Discharge: 2020-08-03 | Disposition: A | Discharge status: Home / Self Care | Payer: BC Managed Care – PPO | Source: Ambulatory Visit | Attending: Nurse Practitioner | Admitting: Nurse Practitioner

## 2020-08-03 DIAGNOSIS — Z853 Personal history of malignant neoplasm of breast: Secondary | ICD-10-CM

## 2020-08-03 DIAGNOSIS — C50412 Malignant neoplasm of upper-outer quadrant of left female breast: Secondary | ICD-10-CM | POA: Diagnosis not present

## 2020-08-03 MED ORDER — GADOBUTROL 1 MMOL/ML IV SOLN
5.0000 mL | Freq: Once | INTRAVENOUS | Status: AC | PRN
  Administered 2020-08-03: 5 mL via INTRAVENOUS

## 2020-08-05 ENCOUNTER — Other Ambulatory Visit: Payer: BC Managed Care – PPO

## 2020-08-05 NOTE — Progress Notes (Signed)
[facility] CONSULT NOTE  Patient Care Team: Shon Baton, MD as PCP - General (Internal Medicine) Rockwell Germany, RN as Oncology Nurse Navigator Mauro Kaufmann, RN as Oncology Nurse Navigator Gery Pray, MD as Consulting Physician (Radiation Oncology) Nicholas Lose, MD as Consulting Physician (Hematology and Oncology) Jovita Kussmaul, MD as Consulting Physician (General Surgery)  CHIEF COMPLAINTS/PURPOSE OF CONSULTATION:  Newly diagnosed breast cancer  HISTORY OF PRESENTING ILLNESS:  Rachael Garza 45 y.o. female is here because of recent diagnosis of invasive ductal carcinoma and ductal carcinoma in situ of the left breast.  Screening mammogram on 07/23/2020 showed a suspicious 2.9 cm mass in the left breast at 2:30.   Diagnostic mammogram and [place] on 08/03/2020 showed 3.2 cm enhancing mass of left breast corresponding with the biopsy results and a 3.7 cm of indeterminate non masslike enhancement in the right breast.  Biopsy on 07/29/2020 showed invasive ductal carcinoma in situ that appears to be grade 2 and is HER2 negative. She presents to the clinic today for initial evaluation and discussion of treatment options.   I reviewed her records extensively and collaborated the history with the patient.  MEDICAL HISTORY:  Past Medical History:  Diagnosis Date   Encounter for insertion of mirena IUD    History of acne    Migraines    Situational stress     SURGICAL HISTORY: Past Surgical History:  Procedure Laterality Date   ? compression fracture L4 in college due to fall     TONSILLECTOMY AND ADENOIDECTOMY      SOCIAL HISTORY: Social History   Socioeconomic History   Marital status: Married    Spouse name: Not on file   Number of children: Not on file   Years of education: Not on file   Highest education level: Not on file  Occupational History   Not on file  Tobacco Use   Smoking status: Never   Smokeless tobacco: Never  Vaping Use   Vaping  Use: Never used  Substance and Sexual Activity   Alcohol use: Yes    Comment: occasiona   Drug use: No   Sexual activity: Not on file  Other Topics Concern   Not on file  Social History Narrative   Works at [employer].  Caffeine none.  Married, children 2.     Social Determinants of Health   Financial Resource Strain: Not on file  Food Insecurity: Not on file  Transportation Needs: Not on file  Physical Activity: Not on file  Stress: Not on file  Social Connections: Not on file  Intimate Partner Violence: Not on file    FAMILY HISTORY: Family History  Problem Relation Age of Onset   Fibromyalgia Mother    Crohn's disease Father    Anuerysm Sister    Melanoma Maternal Grandmother    Breast cancer Paternal Grandmother    Colon cancer Paternal Grandfather     ALLERGIES:  has No Known Allergies.  MEDICATIONS:  Current Outpatient Medications  Medication Sig Dispense Refill   escitalopram (LEXAPRO) 5 MG tablet Take 5 mg by mouth daily.     levonorgestrel (MIRENA) 20 MCG/24HR IUD 1 each by Intrauterine route once.     levonorgestrel (MIRENA, 52 MG,) 20 MCG/24HR IUD Mirena 20 mcg/24 hours (7 yrs) 52 mg intrauterine device  Take 1 device every day by intrauterine route as directed.     Melatonin 2.5 MG CHEW 1 HS     No current facility-administered medications for this visit.  REVIEW OF SYSTEMS:   Constitutional: Denies fevers, chills or abnormal night sweats Eyes: Denies blurriness of vision, double vision or watery eyes Ears, nose, mouth, throat, and face: Denies mucositis or sore throat Respiratory: Denies cough, dyspnea or wheezes Cardiovascular: Denies palpitation, chest discomfort or lower extremity swelling Gastrointestinal:  Denies nausea, heartburn or change in bowel habits Skin: Denies abnormal skin rashes Lymphatics: Denies new lymphadenopathy or easy bruising Neurological:Denies numbness, tingling or new weaknesses Behavioral/Psych: Mood is stable, no new  changes  Breast: Denies any palpable lumps or discharge All other systems were reviewed with the patient and are negative.  PHYSICAL EXAMINATION: ECOG PERFORMANCE STATUS: 1 - Symptomatic but completely ambulatory  Vitals:   08/06/20 1303  BP: 96/69  Pulse: 90  Resp: 18  Temp: 99.1 F (37.3 C)  SpO2: 99%   Filed Weights   08/06/20 1303  Weight: 120 lb 6.4 oz (54.6 kg)    GENERAL:alert, no distress and comfortable SKIN: skin color, texture, turgor are normal, no rashes or significant lesions EYES: normal, conjunctiva are pink and non-injected, sclera clear OROPHARYNX:no exudate, no erythema and lips, buccal mucosa, and tongue normal  NECK: supple, thyroid normal size, non-tender, without nodularity LYMPH:  no palpable lymphadenopathy in the cervical, axillary or inguinal LUNGS: clear to auscultation and percussion with normal breathing effort HEART: regular rate & rhythm and no murmurs and no lower extremity edema ABDOMEN:abdomen soft, non-tender and normal bowel sounds Musculoskeletal:no cyanosis of digits and no clubbing  PSYCH: alert & oriented x 3 with fluent speech NEURO: no focal motor/sensory deficits BREAST:No palpable nodules in breast. No palpable axillary or supraclavicular lymphadenopathy (exam performed in the presence of a chaperone)   LABORATORY DATA:  I have reviewed the data as listed Lab Results  Component Value Date   WBC 5.7 08/06/2020   HGB 13.3 08/06/2020   HCT 37.8 08/06/2020   MCV 87.9 08/06/2020   PLT 293 08/06/2020   Lab Results  Component Value Date   NA 138 08/06/2020   K 3.5 08/06/2020   CL 103 08/06/2020   CO2 26 08/06/2020    RADIOGRAPHIC STUDIES: I have personally reviewed the radiological reports and agreed with the findings in the report.  ASSESSMENT AND PLAN:  Malignant neoplasm of upper-outer quadrant of left breast in female, estrogen receptor positive ([facility]) 07/29/20:  Palpable left breast mass (very dense breasts), Mammogram  revealed 2.9 cm masss, MRI 3.2 cm mass: Biopsy Grade 2 IDC ER 95%, PR 95%, Her 2 Neg, Ki 67 15% MRI on Right breast: 3.7 cm NME LIQ and 1.2 cm NME UIQ: Needs biopsies  Pathology and radiology counseling: Discussed with the patient, the details of pathology including the type of breast cancer,the clinical staging, the significance of ER, PR and HER-2/neu receptors and the implications for treatment. After reviewing the pathology in detail, we proceeded to discuss the different treatment options between surgery, radiation, chemotherapy, antiestrogen therapies.  Treatment Plan: 1. Oncotype DX to determine if she will need chemo. 2. If shes low risk: she will go for surgery upfront 3. If shes high risk: She will get neoadj chemo. 4. Adjuvant XRT 5. Adjuvant Anti-estrogen therapy 6. Genetics: she migth consider --------------------------------------------------------------- RTC based on oncotype Dx  All questions were answered. The patient knows to call the clinic with any problems, questions or concerns.   Rulon Eisenmenger, MD, MPH 08/06/2020   I, Reinaldo Raddle, am acting as scribe for Dr. Nicholas Lose, MD.

## 2020-08-06 ENCOUNTER — Other Ambulatory Visit: Payer: Self-pay | Admitting: General Surgery

## 2020-08-06 ENCOUNTER — Encounter: Payer: Self-pay | Admitting: *Deleted

## 2020-08-06 ENCOUNTER — Telehealth: Payer: Self-pay | Admitting: *Deleted

## 2020-08-06 ENCOUNTER — Encounter: Payer: Self-pay | Admitting: Genetic Counselor

## 2020-08-06 ENCOUNTER — Ambulatory Visit
Admission: RE | Admit: 2020-08-06 | Discharge: 2020-08-06 | Disposition: A | Discharge status: Home / Self Care | Payer: BC Managed Care – PPO | Source: Ambulatory Visit | Attending: Radiation Oncology | Admitting: Radiation Oncology

## 2020-08-06 ENCOUNTER — Ambulatory Visit: Payer: BC Managed Care – PPO | Attending: General Surgery | Admitting: Physical Therapy

## 2020-08-06 ENCOUNTER — Inpatient Hospital Stay: Payer: BC Managed Care – PPO | Attending: Hematology and Oncology | Admitting: Hematology and Oncology

## 2020-08-06 ENCOUNTER — Inpatient Hospital Stay: Payer: BC Managed Care – PPO

## 2020-08-06 ENCOUNTER — Other Ambulatory Visit: Payer: Self-pay

## 2020-08-06 ENCOUNTER — Ambulatory Visit (HOSPITAL_BASED_OUTPATIENT_CLINIC_OR_DEPARTMENT_OTHER): Payer: BC Managed Care – PPO | Admitting: Genetic Counselor

## 2020-08-06 ENCOUNTER — Encounter: Payer: Self-pay | Admitting: Physical Therapy

## 2020-08-06 DIAGNOSIS — R293 Abnormal posture: Secondary | ICD-10-CM | POA: Insufficient documentation

## 2020-08-06 DIAGNOSIS — Z800 Family history of malignant neoplasm of digestive organs: Secondary | ICD-10-CM | POA: Diagnosis not present

## 2020-08-06 DIAGNOSIS — Z170 Estrogen receptor positive status [ER+]: Secondary | ICD-10-CM | POA: Insufficient documentation

## 2020-08-06 DIAGNOSIS — R928 Other abnormal and inconclusive findings on diagnostic imaging of breast: Secondary | ICD-10-CM

## 2020-08-06 DIAGNOSIS — Z79811 Long term (current) use of aromatase inhibitors: Secondary | ICD-10-CM | POA: Insufficient documentation

## 2020-08-06 DIAGNOSIS — Z9221 Personal history of antineoplastic chemotherapy: Secondary | ICD-10-CM | POA: Insufficient documentation

## 2020-08-06 DIAGNOSIS — C50412 Malignant neoplasm of upper-outer quadrant of left female breast: Secondary | ICD-10-CM | POA: Diagnosis not present

## 2020-08-06 DIAGNOSIS — Z803 Family history of malignant neoplasm of breast: Secondary | ICD-10-CM | POA: Diagnosis not present

## 2020-08-06 DIAGNOSIS — Z808 Family history of malignant neoplasm of other organs or systems: Secondary | ICD-10-CM | POA: Diagnosis not present

## 2020-08-06 LAB — CBC WITH DIFFERENTIAL (CANCER CENTER ONLY)
Abs Immature Granulocytes: 0.01 10*3/uL (ref 0.00–0.07)
Basophils Absolute: 0.1 10*3/uL (ref 0.0–0.1)
Basophils Relative: 1 %
Eosinophils Absolute: 0.1 10*3/uL (ref 0.0–0.5)
Eosinophils Relative: 1 %
HCT: 37.8 % (ref 36.0–46.0)
Hemoglobin: 13.3 g/dL (ref 12.0–15.0)
Immature Granulocytes: 0 %
Lymphocytes Relative: 26 %
Lymphs Abs: 1.5 10*3/uL (ref 0.7–4.0)
MCH: 30.9 pg (ref 26.0–34.0)
MCHC: 35.2 g/dL (ref 30.0–36.0)
MCV: 87.9 fL (ref 80.0–100.0)
Monocytes Absolute: 0.3 10*3/uL (ref 0.1–1.0)
Monocytes Relative: 4 %
Neutro Abs: 3.9 10*3/uL (ref 1.7–7.7)
Neutrophils Relative %: 68 %
Platelet Count: 293 10*3/uL (ref 150–400)
RBC: 4.3 MIL/uL (ref 3.87–5.11)
RDW: 12 % (ref 11.5–15.5)
WBC Count: 5.7 10*3/uL (ref 4.0–10.5)
nRBC: 0 % (ref 0.0–0.2)

## 2020-08-06 LAB — CMP (CANCER CENTER ONLY)
ALT: 10 U/L (ref 0–44)
AST: 15 U/L (ref 15–41)
Albumin: 3.7 g/dL (ref 3.5–5.0)
Alkaline Phosphatase: 28 U/L — ABNORMAL LOW (ref 38–126)
Anion gap: 9 (ref 5–15)
BUN: 15 mg/dL (ref 6–20)
CO2: 26 mmol/L (ref 22–32)
Calcium: 9 mg/dL (ref 8.9–10.3)
Chloride: 103 mmol/L (ref 98–111)
Creatinine: 0.74 mg/dL (ref 0.44–1.00)
GFR, Estimated: >60 mL/min (ref >60)
Glucose, Bld: 163 mg/dL — ABNORMAL HIGH (ref 70–99)
Potassium: 3.5 mmol/L (ref 3.5–5.1)
Sodium: 138 mmol/L (ref 135–145)
Total Bilirubin: 0.8 mg/dL (ref 0.3–1.2)
Total Protein: 7.1 g/dL (ref 6.5–8.1)

## 2020-08-06 LAB — GENETIC SCREENING ORDER

## 2020-08-06 NOTE — Telephone Encounter (Signed)
Ordered oncotype (core) per Dr. Lindi Adie. Faxed requisition to [facility] and [facility].

## 2020-08-06 NOTE — Assessment & Plan Note (Signed)
07/29/20:  Palpable left breast mass (very dense breasts), Mammogram revealed 2.9 cm masss, MRI 3.2 cm mass: Biopsy Grade 2 IDC ER 95%, PR 95%, Her 2 Neg, Ki 67 15% MRI on Right breast: 3.7 cm NME LIQ and 1.2 cm NME UIQ: Needs biopsies  Pathology and radiology counseling: Discussed with the patient, the details of pathology including the type of breast cancer,the clinical staging, the significance of ER, PR and HER-2/neu receptors and the implications for treatment. After reviewing the pathology in detail, we proceeded to discuss the different treatment options between surgery, radiation, chemotherapy, antiestrogen therapies.  Treatment Plan: 1. Oncotype DX to determine if she will need chemo. 2. If shes low risk: she will go for surgery upfront 3. If shes high risk: She will get neoadj chemo. 4. Adjuvant XRT 5. Adjuvant Anti-estrogen therapy 6. Genetics: she migth consider --------------------------------------------------------------- RTC based on oncotype Dx

## 2020-08-06 NOTE — Progress Notes (Signed)
REFERRING PROVIDER: Nicholas Lose, MD [place],  [place] [address]   PRIMARY PROVIDER:  Shon Baton, MD  PRIMARY REASON FOR VISIT:  Encounter Diagnoses  Name Primary?   Malignant neoplasm of upper-outer quadrant of left breast in female, estrogen receptor positive ([place]) Yes   Family history of breast cancer    Family history of colon cancer    Family history of melanoma      HISTORY OF PRESENT ILLNESS:   [name], a 45 y.o. female, was seen for a [facility] cancer genetics consultation during the breast multidisciplinary clinic at the request of Dr. Lindi Adie due to a personal and family history of cancer.  Rachael Garza presents to clinic today to discuss the possibility of a hereditary predisposition to cancer, to discuss genetic testing, and to further clarify her future cancer risks, as well as potential cancer risks for family members.   In June 2022, at the age of 89, Rachael Garza was diagnosed with invasive ductal carcinoma of the left breast. The treatment plan is pending Oncotype DX results and biopsies of right breast.   CANCER HISTORY:  Oncology History  Malignant neoplasm of upper-outer quadrant of left breast in female, estrogen receptor positive ([place])  07/29/2020 Initial Diagnosis    Palpable left breast mass (very dense breasts), Mammogram revealed 2.9 cm masss, MRI 3.2 cm mass: Biopsy Grade 2 IDC ER 95%, PR 95%, Her 2 Neg, Ki 67 15% MRI on Right breast: 3.7 cm NME LIQ and 1.2 cm NME UIQ: Needs biopsies   08/06/2020 Cancer Staging   Staging form: Breast, AJCC 8th Edition - Clinical stage from 08/06/2020: Stage IB (cT2, cN0, cM0, G2, ER+, PR+, HER2-) - Signed by Nicholas Lose, MD on 08/06/2020  Stage prefix: Initial diagnosis  Histologic grading system: 3 grade system  Laterality: Left  Staged by: Pathologist and managing physician  Stage used in treatment planning: Yes  National guidelines used in treatment planning: Yes  Type of national  guideline used in treatment planning: NCCN      RISK FACTORS:  Menarche was at age 38.  First live birth at age 58.  OCP use for approximately 0 years.  Ovaries intact: yes.  Hysterectomy: no.  Menopausal status: premenopausal.  HRT use: 0 years. Colonoscopy: no; not examined. Mammogram within the last year: yes. Up to date with pelvic exams: yes; most recent PAP in Feb 2022   Past Medical History:  Diagnosis Date   Encounter for insertion of mirena IUD    History of acne    Migraines    Situational stress     Past Surgical History:  Procedure Laterality Date   ? compression fracture L4 in college due to fall     TONSILLECTOMY AND ADENOIDECTOMY      Social History   Socioeconomic History   Marital status: Married    Spouse name: Not on file   Number of children: Not on file   Years of education: Not on file   Highest education level: Not on file  Occupational History   Not on file  Tobacco Use   Smoking status: Never   Smokeless tobacco: Never  Vaping Use   Vaping Use: Never used  Substance and Sexual Activity   Alcohol use: Yes    Comment: occasiona   Drug use: No   Sexual activity: Not on file  Other Topics Concern   Not on file  Social History Narrative   Works at [employer].  Caffeine none.  Married, children 2.     Social Determinants of Health   Financial Resource Strain: Not on file  Food Insecurity: Not on file  Transportation Needs: Not on file  Physical Activity: Not on file  Stress: Not on file  Social Connections: Not on file     FAMILY HISTORY:  We obtained a detailed, 4-generation family history.  Significant diagnoses are listed below: Family History  Problem Relation Age of Onset   Melanoma Sister 55   Melanoma Maternal Grandfather 50       metastatic   Breast cancer Paternal Grandmother 11   Colon cancer Paternal Grandmother 60   Uterine cancer Cousin 37       maternal cousin    Ms. Lasorsa is unaware of previous family  history of genetic testing for hereditary cancer risks. There is no reported Ashkenazi Jewish ancestry. There is no known consanguinity.  GENETIC COUNSELING ASSESSMENT: Rachael Garza is a 45 y.o. female with a personal and family history of cancer which is somewhat suggestive of a hereditary cancer syndrome and predisposition to cancer given her age of diagnosis and the presence of related cancers in the family. We, therefore, discussed and recommended the following at today's visit.   DISCUSSION: We discussed that 5 - 10% of cancer is hereditary, with most cases of hereditary breast cancer associated with mutations in BRCA1/2.  There are other genes that can be associated with hereditary breast cancer syndromes.  Type of cancer risk and level of risk are gene-specific. We discussed that testing is beneficial for several reasons including knowing how to follow individuals after completing their treatment, identifying whether potential treatment options would be beneficial, and understanding if other family members could be at risk for cancer and allowing them to undergo genetic testing.   We reviewed the characteristics, features and inheritance patterns of hereditary cancer syndromes. We also discussed genetic testing, including the appropriate family members to test, the process of testing, insurance coverage and turn-around-time for results. We discussed the implications of a negative, positive and/or variant of uncertain significant result. In order to get genetic test results in a timely manner so that Ms. Brunetti can use these genetic test results for surgical decisions, we recommended Ms. Florene Glen pursue genetic testing for the [facility] BRCAPlus STAT Panel.  The BRCAplus panel offered by [facility] and includes sequencing and deletion/duplication analysis for the following 8 genes: ATM, BRCA1, BRCA2, CDH1, CHEK2, PALB2, PTEN, and TP53. Once complete, we recommend Ms. Sohm pursue reflex genetic testing to a  more comprehensive gene panel.   The CancerNext-Expanded gene panel offered by [facility] and includes sequencing, rearrangement, and RNA analysis for the following 77 genes: AIP, ALK, APC, ATM, AXIN2, BAP1, BARD1, BLM, BMPR1A, BRCA1, BRCA2, BRIP1, CDC73, CDH1, CDK4, CDKN1B, CDKN2A, CHEK2, CTNNA1, DICER1, FANCC, FH, FLCN, GALNT12, KIF1B, LZTR1, MAX, MEN1, MET, MLH1, MSH2, MSH3, MSH6, MUTYH, NBN, NF1, NF2, NTHL1, PALB2, PHOX2B, PMS2, POT1, PRKAR1A, PTCH1, PTEN, RAD51C, RAD51D, RB1, RECQL, RET, SDHA, SDHAF2, SDHB, SDHC, SDHD, SMAD4, SMARCA4, SMARCB1, SMARCE1, STK11, SUFU, TMEM127, TP53, TSC1, TSC2, VHL and XRCC2 (sequencing and deletion/duplication); EGFR, EGLN1, HOXB13, KIT, MITF, PDGFRA, POLD1, and POLE (sequencing only); EPCAM and GREM1 (deletion/duplication only).   Based on Ms. Dumais's personal and family history of breast cancer, she meets medical criteria for genetic testing. Despite that she meets criteria, she may still have an out of pocket cost. We discussed that if her out of pocket cost for testing is over $100, the laboratory should contact her to  discuss self-pay prices, patient pay assistance programs, if applicable, and other billing options.   PLAN: After considering the risks, benefits, and limitations, Ms. Florene Glen provided informed consent to pursue genetic testing and the blood sample was sent to [facility] for analysis of the BRCAPlus and CancerNext-Expanded +RNAinsight Panel. Results should be available within approximately 1-2 weeks' time, at which point they will be disclosed by telephone to Ms. Jaroszewski, as will any additional recommendations warranted by these results. Ms. Duce will receive a summary of her genetic counseling visit and a copy of her results once available. This information will also be available in Epic.   Lastly, we encouraged Ms. Eckerson to remain in contact with cancer genetics annually so that we can continuously update the family history and inform  her of any changes in cancer genetics and testing that may be of benefit for this family.   Ms. Lykins questions were answered to her satisfaction today. Our contact information was provided should additional questions or concerns arise. Thank you for the referral and allowing [place] to share in the care of your patient.   Jeda Pardue M. Joette Catching, [place], [facility] Genetic Counselor [email or web address] (P) [phone]  The patient was seen for a total of 20 minutes in face-to-face genetic counseling.  The patient brought her husband, Rachel Bo, to the appointment.  Drs. Magrinat, Lindi Adie and/or Burr Medico were available to discuss this case as needed.  _______________________________________________________________________ For Office Staff:  Number of people involved in session: 2 Was an Intern/ student involved with case: no

## 2020-08-06 NOTE — Patient Instructions (Signed)

## 2020-08-06 NOTE — Therapy (Signed)
[place], [place], [address] Phone: [phone]   Fax:  [phone]  Physical Therapy Evaluation  Patient Details  Name: Rachael Garza MRN: [id number] Date of Birth: 11-10-1975 Referring Provider (PT): Dr. Autumn Messing   Encounter Date: 08/06/2020   PT End of Session - 08/06/20 1347     Visit Number 1    Number of Visits 2   For breast cancer; still on 2 of 4 for dizziness   PT Start Time 1523    PT Stop Time 1556    PT Time Calculation (min) 33 min    Activity Tolerance Patient tolerated treatment well    Behavior During Therapy [facility] for tasks assessed/performed             Past Medical History:  Diagnosis Date   Encounter for insertion of mirena IUD    History of acne    Migraines    Situational stress     Past Surgical History:  Procedure Laterality Date   ? compression fracture L4 in college due to fall     TONSILLECTOMY AND ADENOIDECTOMY      There were no vitals filed for this visit.    Subjective Assessment - 08/06/20 1319     Subjective Patient reports she is here today at the breast clinic to be seen by her medical team for her newly diagnosed left breast cancer.    Pertinent History Patient was diagnosed on 07/23/2020 with left grade II invasive ductal carcinoma breast cancer. It measures 3.2 cm on MRI and is located in the upper outer quadrant. It is ER/PR positive and HER2 negative with a Ki67 of 15%. She also has 2 areas on her right breast that apeared abnormal on her MRI and will require biopsies.    Patient Stated Goals Reduce lymphedema risk and learn post op shoulder ROM HEP    Currently in Pain? No/denies                [facility] PT Assessment - 08/06/20 0001       Assessment   Medical Diagnosis Left breast cancer.    Referring Provider (PT) Dr. Autumn Messing    Onset Date/Surgical Date 07/23/20    Hand Dominance Right    Prior Therapy None related to breast CA       Precautions   Precautions Other (comment)    Precaution Comments active cancer; anxiety, HA, migraine, cervicalgia, occipital neuralgia, insomnia, vertigo      Restrictions   Weight Bearing Restrictions No      Balance Screen   Has the patient fallen in the past 6 months No    Has the patient had a decrease in activity level because of a fear of falling?  No    Is the patient reluctant to leave their home because of a fear of falling?  No      Home Environment   Living Environment Private residence    Living Arrangements Spouse/significant other;Children   Husband and 69 y.o. 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    Leisure She walks 4-5x/week for 30-45 min      Cognition   Overall Cognitive Status Within Functional Limits for tasks assessed      ROM / Strength   AROM / PROM / Strength AROM;Strength  AROM   Overall AROM Comments Cervical AROM    AROM Assessment Site Shoulder    Right/Left Shoulder Right;Left    Right Shoulder Extension 60 Degrees    Right Shoulder Flexion 161 Degrees    Right Shoulder ABduction 172 Degrees    Right Shoulder Internal Rotation 83 Degrees    Right Shoulder External Rotation 84 Degrees    Left Shoulder Extension 52 Degrees    Left Shoulder Flexion 159 Degrees    Left Shoulder ABduction 168 Degrees    Left Shoulder Internal Rotation 61 Degrees    Left Shoulder External Rotation 74 Degrees      Strength   Overall Strength Within functional limits for tasks performed               LYMPHEDEMA/ONCOLOGY QUESTIONNAIRE - 08/06/20 0001       Type   Cancer Type Left breast cancer      Lymphedema Assessments   Lymphedema Assessments Upper extremities      Right Upper Extremity Lymphedema   10 cm Proximal to Olecranon Process 24.8 cm    Olecranon Process 22.1 cm    10 cm Proximal to Ulnar  Styloid Process 20.2 cm    Just Proximal to Ulnar Styloid Process 13.6 cm    Across Hand at [employer] 17.2 cm    At [place] of 2nd Digit 6 cm      Left Upper Extremity Lymphedema   10 cm Proximal to Olecranon Process 24.3 cm    Olecranon Process 22 cm    10 cm Proximal to Ulnar Styloid Process 19.5 cm    Just Proximal to Ulnar Styloid Process 13.8 cm    Across Hand at [employer] 18.2 cm    At [place] of 2nd Digit 5.7 cm             L-DEX FLOWSHEETS - 08/06/20 1300       L-DEX LYMPHEDEMA SCREENING   Measurement Type Unilateral    L-DEX MEASUREMENT EXTREMITY Upper Extremity    POSITION  Standing    DOMINANT SIDE Right    At Risk Side Left   Left cancer diagnosis at baseline but right breast biopsies pending   BASELINE SCORE (UNILATERAL) -4.1              The patient was assessed using the L-Dex machine today to produce a lymphedema index baseline score. The patient will be reassessed on a regular basis (typically every 3 months) to obtain new L-Dex scores. If the score is > 6.5 points away from his/her baseline score indicating onset of subclinical lymphedema, it will be recommended to wear a compression garment for 4 weeks, 12 hours per day and then be reassessed. If the score continues to be > 6.5 points from baseline at reassessment, we will initiate lymphedema treatment. Assessing in this manner has a 95% rate of preventing clinically significant lymphedema.     Katina Dung - 08/06/20 0001     Open a tight or new jar Mild difficulty    Do heavy household chores (wash walls, wash floors) No difficulty    Carry a shopping bag or briefcase No difficulty    Wash your back No difficulty    Use a knife to cut food No difficulty    Recreational activities in which you take some force or impact through your arm, shoulder, or hand (golf, hammering, tennis) Mild difficulty    During the past week, to what extent has your arm,  shoulder or hand problem interfered with your  normal social activities with family, friends, neighbors, or groups? Not at all    During the past week, to what extent has your arm, shoulder or hand problem limited your work or other regular daily activities Not at all    Arm, shoulder, or hand pain. Mild    Tingling (pins and needles) in your arm, shoulder, or hand Mild    Difficulty Sleeping No difficulty    DASH Score 9.09 %              Objective measurements completed on examination: See above findings.    Patient was instructed today in a home exercise program today for post op shoulder range of motion. These included active assist shoulder flexion in sitting, scapular retraction, wall walking with shoulder abduction, and hands behind head external rotation.  She was encouraged to do these twice a day, holding 3 seconds and repeating 5 times when permitted by her physician.             PT Education - 08/06/20 1346     Education Details Lymphedema risk reduction and post op shoulder ROM HEP    Person(s) Educated Patient    Methods Explanation;Demonstration;Handout    Comprehension Returned demonstration;Verbalized understanding                 PT Long Term Goals - 08/06/20 1357       PT LONG TERM GOAL #5   Title Patient will demonstrate she has regainde full shoulder ROM post operatively compared to baseline assessment.    Time 8    Period Weeks    Status New    Target Date 10/01/20             Breast Clinic Goals - 08/06/20 1357       Patient will be able to verbalize understanding of pertinent lymphedema risk reduction practices relevant to her diagnosis specifically related to skin care.   Time 1    Period Days    Status Achieved      Patient will be able to return demonstrate and/or verbalize understanding of the post-op home exercise program related to regaining shoulder range of motion.   Time 1    Period Days    Status Achieved      Patient will be able to verbalize understanding  of the importance of attending the postoperative After Breast Cancer Class for further lymphedema risk reduction education and therapeutic exercise.   Time 1    Period Days    Status Achieved                   Plan - 08/06/20 1348     Clinical Impression Statement Patient was diagnosed on 07/23/2020 with left grade II invasive ductal carcinoma breast cancer. It measures 3.2 cm on MRI and is located in the upper outer quadrant. It is ER/PR positive and HER2 negative with a Ki67 of 15%. Her multidisciplinary medical team met prior to her assessments to determine a recommended treatment plan. She is planning to have additional biopsies performed on her right breast as there were abnormal findings on her MRI. Depending on those results, her plan may change. Currently, the plan is for a left lumpectomy and sentinel node biopsy followed by Oncotype testing, radiation, and anti-estrogen therapy. She will benefit from a post op PT reassessment to determine needs and from L-Dex screens every 3 months for 2 years to detect subclinical lymphedema.  Stability/Clinical Decision Making Stable/Uncomplicated    Clinical Decision Making Low    Rehab Potential Excellent    PT Frequency --   Eval and 1 f/u visit   PT Treatment/Interventions Therapeutic exercise;Patient/family education;ADLs/Self Care Home Management    PT Next Visit Plan Will reassess 3-4 weeks post op to determine needs    PT Home Exercise Plan Post op shoulder ROM HEP    Consulted and Agree with Plan of Care Patient             Patient will benefit from skilled therapeutic intervention in order to improve the following deficits and impairments:  Postural dysfunction, Decreased range of motion, Decreased knowledge of precautions, Impaired UE functional use, Pain  Visit Diagnosis: Malignant neoplasm of upper-outer quadrant of left breast in female, estrogen receptor positive ([place]) - Plan: PT plan of care cert/re-cert  Abnormal  posture - Plan: PT plan of care cert/re-cert  Patient will follow up at outpatient cancer rehab 3-4 weeks following surgery.  If the patient requires physical therapy at that time, a specific plan will be dictated and sent to the referring physician for approval. The patient was educated today on appropriate basic range of motion exercises to begin post operatively and the importance of attending the After Breast Cancer class following surgery.  Patient was educated today on lymphedema risk reduction practices as it pertains to recommendations that will benefit the patient immediately following surgery.  She verbalized good understanding.      Problem List Patient Active Problem List   Diagnosis Date Noted   Malignant neoplasm of upper-outer quadrant of left breast in female, estrogen receptor positive ([place]) 08/01/2020   Migraine without aura and without status migrainosus, not intractable 03/02/2020   Cervicalgia 03/02/2020   Bilateral occipital neuralgia 03/02/2020   Annia Friendly, PT 08/06/20 4:05 PM   [address] [place], [place], [address] Phone: [phone]   Fax:  [phone]  Name: Rachael Garza MRN: [id number] Date of Birth: Apr 25, 1975

## 2020-08-06 NOTE — Progress Notes (Signed)
Radiation Oncology         [phone]) [phone] ________________________________  Multidisciplinary Breast Oncology Clinic [facility]) Initial Outpatient Consultation  Name: Rachael Garza MRN: [id number]  Date: 08/06/2020  DOB: Jul 12, 1975  RF:[id number], Jenny Reichmann, MD  Jovita Kussmaul, MD   REFERRING PHYSICIAN: [name], MD  DIAGNOSIS: The encounter diagnosis was Malignant neoplasm of upper-outer quadrant of left breast in female, estrogen receptor positive (Rachael Garza).  Left Breast UOQ,, T2,, Nx Invasive Ductal Carcinoma DCIS, ER+ / PR+ / Her2-, Grade 2    ICD-10-CM   1. Malignant neoplasm of upper-outer quadrant of left breast in female, estrogen receptor positive (Rachael Garza)  C50.412    Z17.0       HISTORY OF PRESENT ILLNESS::Rachael Garza is a 45 y.o. female who is presenting to the office today for evaluation of her newly diagnosed breast cancer. She is accompanied by her husband. She is doing well overall.   She underwent bilateral diagnostic mammography with tomography and left breast ultrasonography at [facility] on 07/23/20 showing: a suspicious 2.9 cm mass in the left breast at 2:30 o'clock. (Of note: past history of right breast mass seen on right mammogram from 03/16/19; confirmed to be stable from mammogram taken on 09/18/19)  Biopsy on 07/29/20 showed: left breast invasive ductal carcinoma, DCIS. Prognostic indicators significant for: estrogen receptor, 95% positive and progesterone receptor, 95% positive, both with strong staining intensity. Proliferation marker Ki67 at 15%. HER2 negative.  Menarche: 45 years old Age at first live birth: 45 years old GP: 2 LMP: (stated none due to IUD, no date given as to last known period) Contraceptive: Yes, Mirena IUD HRT: not answered    The patient was referred today for presentation in the multidisciplinary conference.  Radiology studies and pathology slides were presented there for review and discussion of treatment options. A consensus was  discussed regarding potential next steps.  PREVIOUS RADIATION THERAPY: No  PAST MEDICAL HISTORY:  Past Medical History:  Diagnosis Date   Encounter for insertion of mirena IUD    History of acne    Migraines    Situational stress     PAST SURGICAL HISTORY: Past Surgical History:  Procedure Laterality Date   ? compression fracture L4 in college due to fall     TONSILLECTOMY AND ADENOIDECTOMY      FAMILY HISTORY:  Family History  Problem Relation Age of Onset   Fibromyalgia Mother    Crohn's disease Father    Anuerysm Sister    Breast cancer Paternal Grandmother    Colon cancer Paternal Grandfather     SOCIAL HISTORY:  Social History   Socioeconomic History   Marital status: Married    Spouse name: Not on file   Number of children: Not on file   Years of education: Not on file   Highest education level: Not on file  Occupational History   Not on file  Tobacco Use   Smoking status: Never   Smokeless tobacco: Never  Vaping Use   Vaping Use: Never used  Substance and Sexual Activity   Alcohol use: Yes    Comment: occasiona   Drug use: No   Sexual activity: Not on file  Other Topics Concern   Not on file  Social History Narrative   Works at [employer].  Caffeine none.  Married, children 2.     Social Determinants of Health   Financial Resource Strain: Not on file  Food Insecurity: Not on file  Transportation Needs: Not on file  Physical Activity: Not on file  Stress: Not on file  Social Connections: Not on file    ALLERGIES: No Known Allergies  MEDICATIONS:  Current Outpatient Medications  Medication Sig Dispense Refill   escitalopram (LEXAPRO) 5 MG tablet Take 5 mg by mouth daily.     levonorgestrel (MIRENA) 20 MCG/24HR IUD 1 each by Intrauterine route once.     levonorgestrel (MIRENA, 52 MG,) 20 MCG/24HR IUD Mirena 20 mcg/24 hours (7 yrs) 52 mg intrauterine device  Take 1 device every day by intrauterine route as directed.     Melatonin 2.5 MG CHEW 1  HS     No current facility-administered medications for this encounter.    REVIEW OF SYSTEMS: A 10+ POINT REVIEW OF SYSTEMS WAS OBTAINED including neurology, dermatology, psychiatry, cardiac, respiratory, lymph, extremities, GI, GU, musculoskeletal, constitutional, reproductive, HEENT. On the provided form, she reports weight change, fatigue, night sweats, blurred vision, poor appetite, palpable lump in breast, headaches, anxiety and depression. She denies any other symptoms.    PHYSICAL EXAM:   Vitals with BMI 08/06/2020  Height _0   Weight 120 lbs 6 oz  BMI 49.67  Systolic 96  Diastolic 69  Pulse 90    Lungs are clear to auscultation bilaterally. Heart has regular rate and rhythm. No palpable cervical, supraclavicular, or axillary adenopathy. Abdomen soft, non-tender, normal bowel sounds. Breast: Right breast with no palpable mass, nipple discharge, or bleeding. Left breast with some bruising in the lateral aspect of the breast, and palpable mass in the upper outer quadrant and fibrocystic changes throughout both breasts.     KPS = 100  100 - Normal; no complaints; no evidence of disease. 90   - Able to carry on normal activity; minor signs or symptoms of disease. 80   - Normal activity with effort; some signs or symptoms of disease. 42   - Cares for self; unable to carry on normal activity or to do active work. 60   - Requires occasional assistance, but is able to care for most of his personal needs. 50   - Requires considerable assistance and frequent medical care. 82   - Disabled; requires special care and assistance. 97   - Severely disabled; hospital admission is indicated although death not imminent. 61   - Very sick; hospital admission necessary; active supportive treatment necessary. 10   - Moribund; fatal processes progressing rapidly. 0     - Dead  Karnofsky DA, Abelmann Vansant, Craver LS and Burchenal [facility] [phone]) The use of the nitrogen mustards in the palliative treatment  of carcinoma: with particular reference to bronchogenic carcinoma Cancer 1 634-56  LABORATORY DATA:  Lab Results  Component Value Date   WBC 5.7 08/06/2020   HGB 13.3 08/06/2020   HCT 37.8 08/06/2020   MCV 87.9 08/06/2020   PLT 293 08/06/2020   Lab Results  Component Value Date   NA 138 08/06/2020   K 3.5 08/06/2020   CL 103 08/06/2020   CO2 26 08/06/2020   Lab Results  Component Value Date   ALT 10 08/06/2020   AST 15 08/06/2020   ALKPHOS 28 (L) 08/06/2020   BILITOT 0.8 08/06/2020    PULMONARY FUNCTION TEST:   Recent Review Flowsheet Data   There is no flowsheet data to display.     RADIOGRAPHY: MR BREAST BILATERAL W WO CONTRAST INC CAD  Result Date: 08/04/2020 CLINICAL DATA:  Biopsy proven invasive ductal carcinoma and ductal carcinoma in-situ in the 2:30 region of the left breast (  ribbon shaped clip). LABS:  None obtained at the time of imaging. EXAM: BILATERAL BREAST MRI WITH AND WITHOUT CONTRAST TECHNIQUE: Multiplanar, multisequence MR images of both breasts were obtained prior to and following the intravenous administration of 5 ml of Gadavist Three-dimensional MR images were rendered by post-processing of the original MR data on an independent workstation. The three-dimensional MR images were interpreted, and findings are reported in the following complete MRI report for this study. Three dimensional images were evaluated at the independent interpreting workstation using the DynaCAD thin client. COMPARISON:  Previous exam(s). FINDINGS: Breast composition: d. Extreme fibroglandular tissue. Background parenchymal enhancement: Moderate. Right breast: In the posterior third of the lower-inner quadrant of the right breast there is 3.7 x 1.3 x 3.0 cm of asymmetric non masslike enhancement (series number 9, image 23). There is non masslike enhancement in the middle third of the upper inner quadrant of the breast measuring 1.2 x 0.8 x 1.0 cm (series number 9, image 63) Left breast:  There is an enhancing mass in the posterior third of the upper-outer quadrant of the left breast measuring 3.2 x 0.9 x 1.7 cm (series number 9, image 83). The susceptibility artifact from the biopsy clip is located along the inferior aspect of the mass. Lymph nodes: No abnormal appearing lymph nodes. Ancillary findings:  None. IMPRESSION: 1. 3.2 cm enhancing mass in the upper-outer quadrant of the left breast corresponding with the biopsy proven invasive ductal carcinoma and ductal carcinoma in-situ. 2. 3.7 cm of indeterminate non masslike enhancement in the lower-inner quadrant of the right breast and 1.2 cm of indeterminate non masslike enhancement in the upper inner quadrant of the right breast. RECOMMENDATION: MR guided core biopsies of the non masslike enhancement in the lower inner and upper inner quadrants of the right breast is recommended. 3.2 cm enhancing mass in the upper-outer quadrant of the left breast corresponding with the biopsy proven malignancy. BI-RADS CATEGORY  4: Suspicious. Electronically Signed   By: Lillia Mountain M.D.   On: 08/04/2020 12:50  US BREAST LTD UNI LEFT INC AXILLA  Result Date: 07/23/2020 CLINICAL DATA:  45 year old female presenting for evaluation of a new palpable lump in the upper-outer left breast. EXAM: DIGITAL DIAGNOSTIC UNILATERAL LEFT MAMMOGRAM WITH TOMOSYNTHESIS AND CAD; ULTRASOUND LEFT BREAST LIMITED TECHNIQUE: Left digital diagnostic mammography and breast tomosynthesis was performed. The images were evaluated with computer-aided detection.; Targeted ultrasound examination of the left breast was performed COMPARISON:  Previous exam(s). ACR Breast Density Category d: The breast tissue is extremely dense, which lowers the sensitivity of mammography. FINDINGS: A BB indicating the palpable site of concern has been placed on the upper-outer quadrant of the left breast. There may be a superficial oval obscured mass measuring 7 mm immediately deep to the palpable marker. On  the MLO images there is a possible area of distortion just inferior and posterior to the palpable marker. Physical exam targeted to the palpable site demonstrates a suspicious firm palpable mass in the upper-outer left breast. Ultrasound targeted to the left breast at 2:30, 7 cm from the nipple demonstrates an irregular hypoechoic mass with indistinct margins measuring approximately 2.9 x 1.7 x 2.0 cm. Ultrasound of the left axilla demonstrates multiple normal-appearing lymph nodes. IMPRESSION: 1.  There is a suspicious 2.9 cm mass in the left breast at 230. 2.  No evidence of left axillary lymphadenopathy. RECOMMENDATION: Ultrasound guided biopsy is recommended for the left breast mass. This has been scheduled for 07/29/2020 at 7:30 a.m. I have discussed the  findings and recommendations with the patient. If applicable, a reminder letter will be sent to the patient regarding the next appointment. BI-RADS CATEGORY  5: Highly suggestive of malignancy. Electronically Signed   By: Ammie Ferrier M.D.   On: 07/23/2020 16:37  MM DIAG BREAST TOMO UNI LEFT  Result Date: 07/23/2020 CLINICAL DATA:  45 year old female presenting for evaluation of a new palpable lump in the upper-outer left breast. EXAM: DIGITAL DIAGNOSTIC UNILATERAL LEFT MAMMOGRAM WITH TOMOSYNTHESIS AND CAD; ULTRASOUND LEFT BREAST LIMITED TECHNIQUE: Left digital diagnostic mammography and breast tomosynthesis was performed. The images were evaluated with computer-aided detection.; Targeted ultrasound examination of the left breast was performed COMPARISON:  Previous exam(s). ACR Breast Density Category d: The breast tissue is extremely dense, which lowers the sensitivity of mammography. FINDINGS: A BB indicating the palpable site of concern has been placed on the upper-outer quadrant of the left breast. There may be a superficial oval obscured mass measuring 7 mm immediately deep to the palpable marker. On the MLO images there is a possible area of  distortion just inferior and posterior to the palpable marker. Physical exam targeted to the palpable site demonstrates a suspicious firm palpable mass in the upper-outer left breast. Ultrasound targeted to the left breast at 2:30, 7 cm from the nipple demonstrates an irregular hypoechoic mass with indistinct margins measuring approximately 2.9 x 1.7 x 2.0 cm. Ultrasound of the left axilla demonstrates multiple normal-appearing lymph nodes. IMPRESSION: 1.  There is a suspicious 2.9 cm mass in the left breast at 230. 2.  No evidence of left axillary lymphadenopathy. RECOMMENDATION: Ultrasound guided biopsy is recommended for the left breast mass. This has been scheduled for 07/29/2020 at 7:30 a.m. I have discussed the findings and recommendations with the patient. If applicable, a reminder letter will be sent to the patient regarding the next appointment. BI-RADS CATEGORY  5: Highly suggestive of malignancy. Electronically Signed   By: Ammie Ferrier M.D.   On: 07/23/2020 16:37  MM CLIP PLACEMENT LEFT  Result Date: 07/29/2020 CLINICAL DATA:  45 year old female status post ultrasound-guided biopsy of the left breast. EXAM: DIAGNOSTIC LEFT MAMMOGRAM POST ULTRASOUND BIOPSY COMPARISON:  Previous exam(s). FINDINGS: Mammographic images were obtained following ultrasound guided biopsy of the left breast. The biopsy marking clip is in expected position at the site of biopsy. IMPRESSION: Appropriate positioning of the ribbon shaped biopsy marking clip at the site of biopsy in the upper-outer left breast. Final Assessment: Post Procedure Mammograms for Marker Placement Electronically Signed   By: Kristopher Oppenheim M.D.   On: 07/29/2020 08:26   [place] LT BREAST BX W LOC DEV 1ST LESION IMG BX SPEC US GUIDE  Addendum Date: 07/30/2020   ADDENDUM REPORT: 07/30/2020 14:14 ADDENDUM: Pathology revealed GRADE II INVASIVE DUCTAL CARCINOMA, DUCTAL CARCINOMA IN SITU of the LEFT breast, 2:30 o'clock, 7cmfn. This was found to be  concordant by Dr. Kristopher Oppenheim. Pathology results were discussed with the patient by telephone. The patient reported doing well after the biopsy with tenderness at the site. Post biopsy instructions and care were reviewed and questions were answered. The patient was encouraged to call [facility] for any additional concerns. The patient was referred to [facility] on August 06, 2020. Patient is scheduled for breast MRI August 03, 2020 due to breast density. Pathology results reported by Stacie Acres RN on 07/30/2020. Electronically Signed   By: Kristopher Oppenheim M.D.   On: 07/30/2020  14:14   Result Date: 07/30/2020 CLINICAL DATA:  45 year old female with a suspicious left breast mass. EXAM: ULTRASOUND GUIDED LEFT BREAST CORE NEEDLE BIOPSY COMPARISON:  Previous exam(s). PROCEDURE: I met with the patient and we discussed the procedure of ultrasound-guided biopsy, including benefits and alternatives. We discussed the high likelihood of a successful procedure. We discussed the risks of the procedure, including infection, bleeding, tissue injury, clip migration, and inadequate sampling. Informed written consent was given. The usual time-out protocol was performed immediately prior to the procedure. Lesion quadrant: Upper outer quadrant Using sterile technique and 1% Lidocaine as local anesthetic, under direct ultrasound visualization, a 12 gauge spring-loaded device was used to perform biopsy of a mass at the 12:30 position using a inferior approach. At the conclusion of the procedure a ribbon shaped tissue marker clip was deployed into the biopsy cavity. Follow up 2 view mammogram was performed and dictated separately. IMPRESSION: Ultrasound guided biopsy of the left breast. No apparent complications. Electronically Signed: By: Kristopher Oppenheim M.D. On: 07/29/2020 08:25     IMPRESSION: Stage T2, Nx Left Breast UOQ,,Invasive  Ductal Carcinoma DCIS, ER+ / PR+ / Her2-, Grade 2   Patient will be a good candidate for breast conservation with radiotherapy to the left breast. We discussed the general course of radiation, potential side effects, and toxicities with radiation and the patient is interested in this approach.   On breast MRI the patient was found to have 2 suspicious areas in the right breast, will undergo MRI guided biopsy tomorrow. Depending on this biopsies, the treatment plan could possibly change.    PLAN:  MRI guided biopsy Oncotype testing from original biopsy  Genetics  Left lumpectomy and sentinel node procedure Adjuvant radiation therapy  Aromatase inhibitor    ------------------------------------------------  Blair Promise, PhD, MD  This document serves as a record of services personally performed by Gery Pray, MD. It was created on his behalf by Roney Mans, a trained medical scribe. The creation of this record is based on the scribe's personal observations and the provider's statements to them. This document has been checked and approved by the attending provider.

## 2020-08-07 ENCOUNTER — Ambulatory Visit
Admission: RE | Admit: 2020-08-07 | Discharge: 2020-08-07 | Disposition: A | Discharge status: Home / Self Care | Payer: BC Managed Care – PPO | Source: Ambulatory Visit | Attending: General Surgery | Admitting: General Surgery

## 2020-08-07 ENCOUNTER — Other Ambulatory Visit: Payer: Self-pay | Admitting: Diagnostic Radiology

## 2020-08-07 ENCOUNTER — Ambulatory Visit: Payer: BC Managed Care – PPO

## 2020-08-07 ENCOUNTER — Encounter: Payer: Self-pay | Admitting: Genetic Counselor

## 2020-08-07 DIAGNOSIS — R928 Other abnormal and inconclusive findings on diagnostic imaging of breast: Secondary | ICD-10-CM

## 2020-08-07 DIAGNOSIS — C50412 Malignant neoplasm of upper-outer quadrant of left female breast: Secondary | ICD-10-CM

## 2020-08-07 DIAGNOSIS — Z803 Family history of malignant neoplasm of breast: Secondary | ICD-10-CM

## 2020-08-07 DIAGNOSIS — N6011 Diffuse cystic mastopathy of right breast: Secondary | ICD-10-CM | POA: Diagnosis not present

## 2020-08-07 DIAGNOSIS — Z808 Family history of malignant neoplasm of other organs or systems: Secondary | ICD-10-CM

## 2020-08-07 DIAGNOSIS — Z800 Family history of malignant neoplasm of digestive organs: Secondary | ICD-10-CM

## 2020-08-07 DIAGNOSIS — Z170 Estrogen receptor positive status [ER+]: Secondary | ICD-10-CM

## 2020-08-07 HISTORY — DX: Family history of malignant neoplasm of other organs or systems: Z80.8

## 2020-08-07 HISTORY — DX: Family history of malignant neoplasm of digestive organs: Z80.0

## 2020-08-07 HISTORY — DX: Family history of malignant neoplasm of breast: Z80.3

## 2020-08-07 MED ORDER — GADOBUTROL 1 MMOL/ML IV SOLN
5.0000 mL | Freq: Once | INTRAVENOUS | Status: AC | PRN
  Administered 2020-08-07: 5 mL via INTRAVENOUS

## 2020-08-08 ENCOUNTER — Encounter: Payer: Self-pay | Admitting: *Deleted

## 2020-08-08 ENCOUNTER — Telehealth: Payer: Self-pay | Admitting: *Deleted

## 2020-08-08 ENCOUNTER — Encounter: Payer: Self-pay | Admitting: General Practice

## 2020-08-08 NOTE — Telephone Encounter (Signed)
Spoke with patient to follow up from [facility] 6/29 and assess navigation needs. She was happy to hear that her bx of the right breast was B9. Informed her we would call her when we have results of her oncotype. Patient verbalized understanding. Encouraged her to call should any other questions or concerns arise.

## 2020-08-08 NOTE — Progress Notes (Signed)
[facility] Psychosocial Distress Screening Spiritual Care  Met with Rachael Garza iby phone following Breast Multidisciplinary Clinic to introduce [facility] team/resources, reviewing distress screen per protocol.  The patient scored a 7 on the Psychosocial Distress Thermometer which indicates severe distress. Also assessed for distress and other psychosocial needs.   ONCBCN DISTRESS SCREENING 08/08/2020  Screening Type Initial Screening  Distress experienced in past week (1-10) 7  Emotional problem type Depression;Nervousness/Anxiety;Adjusting to illness;Isolation/feeling alone;Feeling hopeless  Information Concerns Type Lack of info about diagnosis;Lack of info about treatment  Physical Problem type Sleep/insomnia;Loss of appetitie  Referral to support programs Yes   Rachael Garza was welcoming of support call. She reports good support from husband and family and is waiting to tell more people until she knows more about scope of diagnosis and treatment plan. She describes herself as a "worst-case scenario" person and feels "emotionally drained" by process so far.  Provided empathic listening, normalization of feelings, emotional support, and [facility] referral per request.   Follow up needed: Yes.  Plan to follow up by phone next week for further support.   [place], [place], [facility] Pager [phone] Voicemail [phone]

## 2020-08-14 ENCOUNTER — Telehealth: Payer: Self-pay | Admitting: Genetic Counselor

## 2020-08-14 ENCOUNTER — Encounter: Payer: Self-pay | Admitting: Genetic Counselor

## 2020-08-14 DIAGNOSIS — Z1379 Encounter for other screening for genetic and chromosomal anomalies: Secondary | ICD-10-CM | POA: Insufficient documentation

## 2020-08-14 NOTE — Telephone Encounter (Signed)
Revealed negative genetic testing.  Discussed that we do not know why she has breast cancer or why there is cancer in the family. It could be familial, due to a different gene that we are not testing, or maybe our current technology may not be able to pick something up.  It will be important for her to keep in contact with genetics to keep up with whether additional testing may be needed.  Results of pan-cancer panel pending.

## 2020-08-15 ENCOUNTER — Encounter: Payer: Self-pay | Admitting: General Practice

## 2020-08-15 NOTE — Progress Notes (Signed)
[facility] Spiritual Care Note  Followed up with Rachael Garza by phone. She's still in the anxiety of waiting to learn about oncotype results and plan, but she's keeping active and engaged in activities that distract her.   We plan to follow up by phone in 1-2 weeks for another check-in, and I will submit her name to be added to the Breast Cancer Support Group emails.   [place], [place], [facility] Pager [phone] Voicemail [phone]

## 2020-08-21 ENCOUNTER — Encounter: Payer: Self-pay | Admitting: *Deleted

## 2020-08-21 ENCOUNTER — Telehealth: Payer: Self-pay | Admitting: *Deleted

## 2020-08-21 DIAGNOSIS — Z170 Estrogen receptor positive status [ER+]: Secondary | ICD-10-CM | POA: Diagnosis not present

## 2020-08-21 DIAGNOSIS — C50412 Malignant neoplasm of upper-outer quadrant of left female breast: Secondary | ICD-10-CM | POA: Diagnosis not present

## 2020-08-21 NOTE — Telephone Encounter (Signed)
Received oncotype results of 15/4%. Patient is aware and is ready to schedule sx. She is somewhat torn about whether to have lump vs mast. Discussed this with her and it seems she really wants to try to have lump.  She would like to finalize plans with Dr. Marlou Starks and I will let his nurse know to have him call her when he returns from vacation.

## 2020-08-22 ENCOUNTER — Telehealth: Payer: Self-pay | Admitting: Genetic Counselor

## 2020-08-22 ENCOUNTER — Encounter: Payer: Self-pay | Admitting: General Practice

## 2020-08-22 NOTE — Telephone Encounter (Signed)
Contacted patient in attempt to disclose results of pan-cancer genetics panel.  LVM with contact information requesting a call back.

## 2020-08-22 NOTE — Progress Notes (Signed)
[facility] Spiritual Care Note  Followed up by phone as planned, leaving voicemail with encouragement to return call.   [place], [place], [facility] Pager [phone] Voicemail [phone]

## 2020-08-25 ENCOUNTER — Ambulatory Visit: Payer: Self-pay | Admitting: Genetic Counselor

## 2020-08-25 DIAGNOSIS — Z170 Estrogen receptor positive status [ER+]: Secondary | ICD-10-CM

## 2020-08-25 DIAGNOSIS — Z803 Family history of malignant neoplasm of breast: Secondary | ICD-10-CM

## 2020-08-25 DIAGNOSIS — C50412 Malignant neoplasm of upper-outer quadrant of left female breast: Secondary | ICD-10-CM

## 2020-08-25 DIAGNOSIS — Z808 Family history of malignant neoplasm of other organs or systems: Secondary | ICD-10-CM

## 2020-08-25 DIAGNOSIS — Z800 Family history of malignant neoplasm of digestive organs: Secondary | ICD-10-CM

## 2020-08-25 DIAGNOSIS — Z1379 Encounter for other screening for genetic and chromosomal anomalies: Secondary | ICD-10-CM

## 2020-08-25 NOTE — Telephone Encounter (Signed)
Revealed negative genetic testing of pan-cancer panel and variant of uncertain significance in [facility].  Discussed that we do not know why she has breast cancer or why there is cancer in the family. It could be sporadic/familial, due to a different gene that we are not testing, or maybe our current technology may not be able to pick something up.  It will be important for her to keep in contact with genetics to keep up with whether additional testing may be needed.

## 2020-08-25 NOTE — Progress Notes (Signed)
HPI:  Ms. Pinkus was previously seen in the [facility] due to a personal and family history of cancer and concerns regarding a hereditary predisposition to cancer. Please refer to our prior cancer genetics clinic note for more information regarding our discussion, assessment and recommendations, at the time. Ms. Heatherington recent genetic test results were disclosed to her, as were recommendations warranted by these results. These results and recommendations are discussed in more detail below.  CANCER HISTORY:  Oncology History  Malignant neoplasm of upper-outer quadrant of left breast in female, estrogen receptor positive ([place])  07/29/2020 Initial Diagnosis    Palpable left breast mass (very dense breasts), Mammogram revealed 2.9 cm masss, MRI 3.2 cm mass: Biopsy Grade 2 IDC ER 95%, PR 95%, Her 2 Neg, Ki 67 15% MRI on Right breast: 3.7 cm NME LIQ and 1.2 cm NME UIQ: Needs biopsies   08/06/2020 Cancer Staging   Staging form: Breast, AJCC 8th Edition - Clinical stage from 08/06/2020: Stage IB (cT2, cN0, cM0, G2, ER+, PR+, HER2-) - Signed by Nicholas Lose, MD on 08/06/2020  Stage prefix: Initial diagnosis  Histologic grading system: 3 grade system  Laterality: Left  Staged by: Pathologist and managing physician  Stage used in treatment planning: Yes  National guidelines used in treatment planning: Yes  Type of national guideline used in treatment planning: NCCN    08/13/2020 Genetic Testing   No pathogenic variants detected in [facility] BRCAPlus Panel or CancerNext-Expanded +RNAinsight Panel.  Variant of uncertain significance detected in BLM at  p.A914T (c.2740G>A).  The report dates are August 13, 2020 and August 18, 2020, respectively.    The BRCAplus panel offered by [employer] and includes sequencing and deletion/duplication analysis for the following 8 genes: ATM, BRCA1, BRCA2, CDH1, CHEK2, PALB2, PTEN, and TP53.   The CancerNext-Expanded gene panel offered by [facility] and includes sequencing, rearrangement, and RNA analysis for the following 77 genes: AIP, ALK, APC, ATM, AXIN2, BAP1, BARD1, BLM, BMPR1A, BRCA1, BRCA2, BRIP1, CDC73, CDH1, CDK4, CDKN1B, CDKN2A, CHEK2, CTNNA1, DICER1, FANCC, FH, FLCN, GALNT12, KIF1B, LZTR1, MAX, MEN1, MET, MLH1, MSH2, MSH3, MSH6, MUTYH, NBN, NF1, NF2, NTHL1, PALB2, PHOX2B, PMS2, POT1, PRKAR1A, PTCH1, PTEN, RAD51C, RAD51D, RB1, RECQL, RET, SDHA, SDHAF2, SDHB, SDHC, SDHD, SMAD4, SMARCA4, SMARCB1, SMARCE1, STK11, SUFU, TMEM127, TP53, TSC1, TSC2, VHL and XRCC2 (sequencing and deletion/duplication); EGFR, EGLN1, HOXB13, KIT, MITF, PDGFRA, POLD1, and POLE (sequencing only); EPCAM and GREM1 (deletion/duplication only).      FAMILY HISTORY:  We obtained a detailed, 4-generation family history.  Significant diagnoses are listed below: Family History  Problem Relation Age of Onset   Melanoma Sister 35   Melanoma Maternal Grandfather 49       metastatic   Breast cancer Paternal Grandmother 13   Colon cancer Paternal Grandmother 80   Uterine cancer Cousin 35       maternal cousin    Ms. Windle is unaware of previous family history of genetic testing for hereditary cancer risks. There is no reported Ashkenazi Jewish ancestry. There is no known consanguinity.    GENETIC TEST RESULTS: Genetic testing reported out on August 18, 2020. The [facility] CancerNext-Expanded +RNAinsight Panel found no pathogenic mutations. The CancerNext-Expanded gene panel offered by [facility] and includes sequencing, rearrangement, and RNA analysis for the following 77 genes: AIP, ALK, APC, ATM, AXIN2, BAP1, BARD1, BLM, BMPR1A, BRCA1, BRCA2, BRIP1, CDC73, CDH1, CDK4, CDKN1B, CDKN2A, CHEK2, CTNNA1, DICER1, FANCC, FH, FLCN, GALNT12, KIF1B, LZTR1, MAX, MEN1, MET, MLH1, MSH2, MSH3, MSH6,  MUTYH, NBN, NF1, NF2, NTHL1, PALB2, PHOX2B, PMS2, POT1, PRKAR1A, PTCH1, PTEN, RAD51C, RAD51D, RB1, RECQL, RET, SDHA, SDHAF2, SDHB, SDHC, SDHD, SMAD4, SMARCA4, SMARCB1, SMARCE1,  STK11, SUFU, TMEM127, TP53, TSC1, TSC2, VHL and XRCC2 (sequencing and deletion/duplication); EGFR, EGLN1, HOXB13, KIT, MITF, PDGFRA, POLD1, and POLE (sequencing only); EPCAM and GREM1 (deletion/duplication only).   The test report has been scanned into EPIC and is located under the Molecular Pathology section of the Results Review tab.  A portion of the result report is included below for reference.     We discussed with Ms. Vitullo that because current genetic testing is not perfect, it is possible there may be a gene mutation in one of these genes that current testing cannot detect, but that chance is small.  We also discussed, that there could be another gene that has not yet been discovered, or that we have not yet tested, that is responsible for the cancer diagnoses in the family. It is also possible there is a hereditary cause for the cancer in the family that Ms. Korman did not inherit and therefore was not identified in her testing.  Therefore, it is important to remain in touch with cancer genetics in the future so that we can continue to offer Ms. Stewart the most up to date genetic testing.   Genetic testing did identify a variant of uncertain significance (VUS) was identified in the [facility] gene called  p.A914T (c.2740G>A.  At this time, it is unknown if this variant is associated with increased cancer risk or if this is a normal finding, but most variants such as this get reclassified to being inconsequential. It should not be used to make medical management decisions. With time, we suspect the lab will determine the significance of this variant, if any. If we do learn more about it, we will try to contact Ms. Falkner to discuss it further. However, it is important to stay in touch with [place] periodically and keep the address and phone number up to date.  ADDITIONAL GENETIC TESTING: We discussed with Ms. Lepage that her genetic testing was fairly extensive.  If there are genes identified to increase  cancer risk that can be analyzed in the future, we would be happy to discuss and coordinate this testing at that time.    CANCER SCREENING RECOMMENDATIONS: Ms. Rasmussen test result is considered negative (normal).  This means that we have not identified a hereditary cause for her personal history of cancer at this time. Most cancers happen by chance and this negative test suggests that her cancer may fall into this category.    While reassuring, this does not definitively rule out a hereditary predisposition to cancer. It is still possible that there could be genetic mutations that are undetectable by current technology. There could be genetic mutations in genes that have not been tested or identified to increase cancer risk.  Therefore, it is recommended she continue to follow the cancer management and screening guidelines provided by her oncology and primary healthcare provider.   An individual's cancer risk and medical management are not determined by genetic test results alone. Overall cancer risk assessment incorporates additional factors, including personal medical history, family history, and any available genetic information that may result in a personalized plan for cancer prevention and surveillance  RECOMMENDATIONS FOR FAMILY MEMBERS:  Individuals in this family might be at some increased risk of developing cancer, over the general population risk, simply due to the family history of cancer.  We recommended women in  this family have a yearly mammogram beginning at age 83, or 14 years younger than the earliest onset of cancer, an annual clinical breast exam, and perform monthly breast self-exams. Women in this family should also have a gynecological exam as recommended by their primary provider. Family members should be referred for colonoscopy starting at age 72, or earlier, as recommended by their providers.   It is also possible there is a hereditary cause for the cancer in Ms. Menge's family  that she did not inherit and therefore was not identified in her.  Based on Ms. Marchuk's family history, we recommended her maternal cousin, who was diagnosed with uterine cancer at age 20, have genetic counseling and testing. Ms. Germain will let us know if we can be of any assistance in coordinating genetic counseling and/or testing for this family member.   FOLLOW-UP: Lastly, we discussed with Ms. Rothschild that cancer genetics is a rapidly advancing field and it is possible that new genetic tests will be appropriate for her and/or her family members in the future. We encouraged her to remain in contact with cancer genetics on an annual basis so we can update her personal and family histories and let her know of advances in cancer genetics that may benefit this family.   Our contact number was provided. Ms. Rios questions were answered to her satisfaction, and she knows she is welcome to call us at anytime with additional questions or concerns.     [place] Jaquith M. Joette Catching, [place], [facility] Genetic Counselor [email or web address] (P) [phone]

## 2020-08-26 ENCOUNTER — Encounter: Payer: Self-pay | Admitting: *Deleted

## 2020-08-26 ENCOUNTER — Telehealth: Payer: Self-pay | Admitting: Hematology and Oncology

## 2020-08-26 NOTE — Telephone Encounter (Signed)
Discussed her options of lumpectomy vs mastectomy Patient is having difficulty with the decision. She is anxious about testing annually with MRIs and Mammograms. After discussion, shes leaning towards lumpectomies.

## 2020-08-28 ENCOUNTER — Encounter: Payer: Self-pay | Admitting: *Deleted

## 2020-09-01 ENCOUNTER — Ambulatory Visit: Payer: Self-pay | Admitting: General Surgery

## 2020-09-01 ENCOUNTER — Telehealth: Payer: Self-pay | Admitting: Hematology and Oncology

## 2020-09-01 ENCOUNTER — Other Ambulatory Visit: Payer: Self-pay | Admitting: General Surgery

## 2020-09-01 ENCOUNTER — Encounter: Payer: Self-pay | Admitting: *Deleted

## 2020-09-01 DIAGNOSIS — N6489 Other specified disorders of breast: Secondary | ICD-10-CM

## 2020-09-01 DIAGNOSIS — Z170 Estrogen receptor positive status [ER+]: Secondary | ICD-10-CM

## 2020-09-01 DIAGNOSIS — C50412 Malignant neoplasm of upper-outer quadrant of left female breast: Secondary | ICD-10-CM

## 2020-09-01 NOTE — Telephone Encounter (Signed)
Scheduled appointment per 07/25 sch msg. Left message. 

## 2020-09-02 ENCOUNTER — Telehealth: Payer: Self-pay | Admitting: *Deleted

## 2020-09-02 ENCOUNTER — Encounter: Payer: Self-pay | Admitting: Hematology and Oncology

## 2020-09-02 NOTE — Telephone Encounter (Signed)
Received call from patient wanting to discuss sx. She had a friend that was talking with her and had told her she needed to have bil mastectomies and now is second guessing her decision. We had long discussion regarding this and she understands she needs to do whatever is going to bring her peace of mind. She states she still is leaning towards lumpectomies and doesn't want to cancel anything as of right now.  She will let me know if she changes her mind.

## 2020-09-04 NOTE — Pre-Procedure Instructions (Signed)
Surgical Instructions    Your procedure is scheduled on Tuesday 09/09/20.   Report to [facility] Main Entrance "A" at 1:00 P.M., then check in with the Admitting office.  Call this number if you have problems the morning of surgery:  [phone]   If you have any questions prior to your surgery date call [phone]: Open Monday-Friday 8am-4pm    Remember:  Do not eat after midnight the night before your surgery  You may drink clear liquids until 12:00 P.M. the morning of your surgery.   Clear liquids allowed are: Water, Non-Citrus Juices (without pulp), Carbonated Beverages, Clear Tea, Black Coffee Only, and Gatorade    Take these medicines the morning of surgery with A SIP OF WATER   escitalopram (LEXAPRO)  As of today, STOP taking any Aspirin (unless otherwise instructed by your surgeon) Aleve, Naproxen, Ibuprofen, Motrin, Advil, Goody's, BC's, all herbal medications, fish oil, and all vitamins.                     Do NOT Smoke (Tobacco/Vaping) or drink Alcohol 24 hours prior to your procedure.  If you use a CPAP at night, you may bring all equipment for your overnight stay.   Contacts, glasses, piercing's, hearing aid's, dentures or partials may not be worn into surgery, please bring cases for these belongings.    For patients admitted to the hospital, discharge time will be determined by your treatment team.   Patients discharged the day of surgery will not be allowed to drive home, and someone needs to stay with them for 24 hours.  ONLY 1 SUPPORT PERSON MAY BE PRESENT WHILE YOU ARE IN SURGERY. IF YOU ARE TO BE ADMITTED ONCE YOU ARE IN YOUR ROOM YOU WILL BE ALLOWED TWO (2) VISITORS.  Minor children may have two parents present. Special consideration for safety and communication needs will be reviewed on a case by case basis.   Special instructions:   [place]- Preparing For Surgery  Before surgery, you can play an important role. Because skin is not sterile, your skin  needs to be as free of germs as possible. You can reduce the number of germs on your skin by washing with CHG (chlorahexidine gluconate) Soap before surgery.  CHG is an antiseptic cleaner which kills germs and bonds with the skin to continue killing germs even after washing.    Oral Hygiene is also important to reduce your risk of infection.  Remember - BRUSH YOUR TEETH THE MORNING OF SURGERY WITH YOUR REGULAR TOOTHPASTE  Please do not use if you have an allergy to CHG or antibacterial soaps. If your skin becomes reddened/irritated stop using the CHG.  Do not shave (including legs and underarms) for at least 48 hours prior to first CHG shower. It is OK to shave your face.  Please follow these instructions carefully.   Shower the NIGHT BEFORE SURGERY and the MORNING OF SURGERY  If you chose to wash your hair, wash your hair first as usual with your normal shampoo.  After you shampoo, rinse your hair and body thoroughly to remove the shampoo.  Use CHG Soap as you would any other liquid soap. You can apply CHG directly to the skin and wash gently with a scrungie or a clean washcloth.   Apply the CHG Soap to your body ONLY FROM THE NECK DOWN.  Do not use on open wounds or open sores. Avoid contact with your eyes, ears, mouth and genitals (private parts). Wash Face  and genitals (private parts)  with your normal soap.   Wash thoroughly, paying special attention to the area where your surgery will be performed.  Thoroughly rinse your body with warm water from the neck down.  DO NOT shower/wash with your normal soap after using and rinsing off the CHG Soap.  Pat yourself dry with a CLEAN TOWEL.  Wear CLEAN PAJAMAS to bed the night before surgery  Place CLEAN SHEETS on your bed the night before your surgery  DO NOT SLEEP WITH PETS.   Day of Surgery: Shower with CHG soap. Do not wear jewelry, make up, nail polish, gel polish, artificial nails, or any other type of covering on natural nails  including finger and toenails. If patients have artificial nails, gel coating, etc. that need to be removed by a nail salon please have this removed prior to surgery. Surgery may need to be canceled/delayed if the surgeon/ anesthesia feels like the patient is unable to be adequately monitored. Do not wear lotions, powders, perfumes/colognes, or deodorant. Do not shave 48 hours prior to surgery.  Men may shave face and neck. Do not bring valuables to the hospital. [facility] is not responsible for any belongings or valuables. Wear Clean/Comfortable clothing the morning of surgery Remember to brush your teeth WITH YOUR REGULAR TOOTHPASTE.   Please read over the following fact sheets that you were given.

## 2020-09-05 ENCOUNTER — Encounter (HOSPITAL_COMMUNITY)
Admission: RE | Admit: 2020-09-05 | Discharge: 2020-09-05 | Disposition: A | Discharge status: Home / Self Care | Payer: BC Managed Care – PPO | Source: Ambulatory Visit | Attending: General Surgery | Admitting: General Surgery

## 2020-09-05 ENCOUNTER — Encounter (HOSPITAL_COMMUNITY): Payer: Self-pay

## 2020-09-05 ENCOUNTER — Other Ambulatory Visit: Payer: Self-pay

## 2020-09-05 DIAGNOSIS — C50912 Malignant neoplasm of unspecified site of left female breast: Secondary | ICD-10-CM | POA: Diagnosis not present

## 2020-09-05 DIAGNOSIS — Z01812 Encounter for preprocedural laboratory examination: Secondary | ICD-10-CM | POA: Insufficient documentation

## 2020-09-05 HISTORY — DX: Malignant (primary) neoplasm, unspecified: C80.1

## 2020-09-05 LAB — CBC
HCT: 41.9 % (ref 36.0–46.0)
Hemoglobin: 13.9 g/dL (ref 12.0–15.0)
MCH: 30.3 pg (ref 26.0–34.0)
MCHC: 33.2 g/dL (ref 30.0–36.0)
MCV: 91.3 fL (ref 80.0–100.0)
Platelets: 289 10*3/uL (ref 150–400)
RBC: 4.59 MIL/uL (ref 3.87–5.11)
RDW: 12.5 % (ref 11.5–15.5)
WBC: 6.5 10*3/uL (ref 4.0–10.5)
nRBC: 0 % (ref 0.0–0.2)

## 2020-09-05 LAB — POCT PREGNANCY, URINE: Preg Test, Ur: NEGATIVE

## 2020-09-05 NOTE — Anesthesia Preprocedure Evaluation (Addendum)
Anesthesia Evaluation  Patient identified by MRN, date of birth, ID band Patient awake    Reviewed: Allergy & Precautions, NPO status , Patient's Chart, lab work & pertinent test results  History of Anesthesia Complications Negative for: history of anesthetic complications  Airway Mallampati: I  TM Distance: >3 FB Neck ROM: Full    Dental  (+) Dental Advisory Given   Pulmonary neg pulmonary ROS,    breath sounds clear to auscultation       Cardiovascular negative cardio ROS   Rhythm:Regular Rate:Normal     Neuro/Psych  Headaches,    GI/Hepatic negative GI ROS, Neg liver ROS,   Endo/Other  negative endocrine ROS  Renal/GU negative Renal ROS     Musculoskeletal   Abdominal   Peds  Hematology negative hematology ROS (+)   Anesthesia Other Findings Breast cancer  Reproductive/Obstetrics                            Anesthesia Physical Anesthesia Plan  ASA: 2  Anesthesia Plan: General   Post-op Pain Management: GA combined w/ Regional for post-op pain   Induction: Intravenous  PONV Risk Score and Plan: 3 and Ondansetron, Dexamethasone and Scopolamine patch - Pre-op  Airway Management Planned: LMA  Additional Equipment: None  Intra-op Plan:   Post-operative Plan:   Informed Consent: I have reviewed the patients History and Physical, chart, labs and discussed the procedure including the risks, benefits and alternatives for the proposed anesthesia with the patient or authorized representative who has indicated his/her understanding and acceptance.     Dental advisory given  Plan Discussed with: CRNA and Surgeon  Anesthesia Plan Comments: (PAT note written 09/05/2020 by Myra Gianotti, PA-C. Plan routine monitors, GA with pectoralis blocks for post op analgesia)      Anesthesia Quick Evaluation

## 2020-09-05 NOTE — Progress Notes (Signed)
PCP - Shon Baton MD Cardiologist - denies  PPM/ICD - denies Device Orders -  Rep Notified -   Chest x-ray - none EKG - none  Stress Test - none ECHO - none Cardiac Cath - none  Sleep Study - denies CPAP -   Fasting Blood Sugar -n/a  Checks Blood Sugar _n/a____ times a day  Blood Thinner Instructions:n/a Aspirin Instructions:n/a  ERAS Protcol -clears until 12 pm PRE-SURGERY Ensure or G2-   COVID TEST- no -ambulatory surgery   Anesthesia review: yes-seed patient  Patient denies shortness of breath, fever, cough and chest pain at PAT appointment   All instructions explained to the patient, with a verbal understanding of the material. Patient agrees to go over the instructions while at home for a better understanding. Patient also instructed to self quarantine after being tested for COVID-19. The opportunity to ask questions was provided.

## 2020-09-05 NOTE — Progress Notes (Signed)
Anesthesia Chart Review:  Case: [id number] Date/Time: 09/09/20 1445   Procedure: LEFT BREAST RADIOACTIVE SEED LOCALIZATION LUMPECTOMY WITH SENITNEL LYMPH NODE, AND RIGHT RADIOACTIVE SEED LOCALIZATION LUMPECTOMY x2 - PECTORAL BLOCK   Anesthesia type: General   Pre-op diagnosis: left breast cancer   Location: [facility] OR ROOM 05 / [place] OR   Surgeons: Jovita Kussmaul, MD       DISCUSSION: Patient is a 45 year old female scheduled for the above procedure.  History includes never smoker, migraines, left breast cancer (07/29/20 left breast biopsy: invasive ductal carcinoma, DCIS; had right breast biopsy x2 08/07/20: pseudoangiomatous stromal hyperplasia, fibrocystic changes, no evidence of malignancy)  09/05/2020 urine pregnancy test negative.  CBC normal.  Anesthesia team to evaluate on the day of surgery.  VS: BP 103/78   Pulse 75   Temp 36.8 C (Oral)   Resp 17   Ht '5\' 6"'$  (1.676 m)   Wt 55 kg   SpO2 100%   BMI 19.56 kg/m   PROVIDERS: Shon Baton, MD is PCP Nicholas Lose, MD is Edson Snowball, MD is [facility] Sarina Ill, MD is neurologist last visit 04/21/2020 for follow-up migraines, occipital neuralgia.    LABS: Labs reviewed: Acceptable for surgery. (all labs ordered are listed, but only abnormal results are displayed)  Labs Reviewed  CBC  POCT PREGNANCY, URINE    IMAGING: MRI C-spine 03/26/20: MRI cervical spine without contrast demonstrating: - At C5-6: disc bulging and spondylosis with slight contact upon ventral spinal cord, and moderate right foraminal stenosis; no cord signal changes.    EKG: N/A   CV: N/A  Past Medical History:  Diagnosis Date   Cancer [facility])    Encounter for insertion of mirena IUD    Family history of breast cancer 08/07/2020   Family history of colon cancer 08/07/2020   Family history of melanoma 08/07/2020   History of acne    Migraines    Situational stress     Past Surgical History:  Procedure Laterality Date   ? compression  fracture L4 in college due to fall     TONSILLECTOMY AND ADENOIDECTOMY      MEDICATIONS:  escitalopram (LEXAPRO) 5 MG tablet   levonorgestrel (MIRENA, 52 MG,) 20 MCG/24HR IUD   melatonin 3 MG TABS tablet   No current facility-administered medications for this encounter.    Myra Gianotti, PA-C Surgical Short Stay/Anesthesiology [facility] Phone [phone] [facility] Phone [phone] 09/05/2020 4:25 PM

## 2020-09-09 ENCOUNTER — Other Ambulatory Visit: Payer: Self-pay

## 2020-09-09 ENCOUNTER — Encounter (HOSPITAL_COMMUNITY)
Admission: RE | Disposition: A | Discharge status: Home / Self Care | Payer: Self-pay | Source: Home / Self Care | Attending: General Surgery

## 2020-09-09 ENCOUNTER — Ambulatory Visit
Admission: RE | Admit: 2020-09-09 | Discharge: 2020-09-09 | Disposition: A | Discharge status: Home / Self Care | Payer: BC Managed Care – PPO | Source: Ambulatory Visit | Attending: General Surgery | Admitting: General Surgery

## 2020-09-09 ENCOUNTER — Ambulatory Visit (HOSPITAL_COMMUNITY)
Admission: RE | Admit: 2020-09-09 | Discharge: 2020-09-09 | Disposition: A | Discharge status: Home / Self Care | Payer: BC Managed Care – PPO | Source: Ambulatory Visit | Attending: General Surgery | Admitting: General Surgery

## 2020-09-09 ENCOUNTER — Ambulatory Visit (HOSPITAL_COMMUNITY): Payer: BC Managed Care – PPO | Admitting: Anesthesiology

## 2020-09-09 ENCOUNTER — Encounter (HOSPITAL_COMMUNITY): Payer: Self-pay | Admitting: General Surgery

## 2020-09-09 ENCOUNTER — Ambulatory Visit (HOSPITAL_COMMUNITY): Payer: BC Managed Care – PPO | Admitting: Vascular Surgery

## 2020-09-09 ENCOUNTER — Ambulatory Visit (HOSPITAL_COMMUNITY)
Admission: RE | Admit: 2020-09-09 | Discharge: 2020-09-09 | Disposition: A | Discharge status: Home / Self Care | Payer: BC Managed Care – PPO | Attending: General Surgery | Admitting: General Surgery

## 2020-09-09 DIAGNOSIS — C50412 Malignant neoplasm of upper-outer quadrant of left female breast: Secondary | ICD-10-CM | POA: Insufficient documentation

## 2020-09-09 DIAGNOSIS — N6011 Diffuse cystic mastopathy of right breast: Secondary | ICD-10-CM | POA: Diagnosis not present

## 2020-09-09 DIAGNOSIS — Z170 Estrogen receptor positive status [ER+]: Secondary | ICD-10-CM | POA: Diagnosis not present

## 2020-09-09 DIAGNOSIS — N6091 Unspecified benign mammary dysplasia of right breast: Secondary | ICD-10-CM | POA: Diagnosis not present

## 2020-09-09 DIAGNOSIS — D241 Benign neoplasm of right breast: Secondary | ICD-10-CM | POA: Diagnosis not present

## 2020-09-09 DIAGNOSIS — N6489 Other specified disorders of breast: Secondary | ICD-10-CM

## 2020-09-09 DIAGNOSIS — G8918 Other acute postprocedural pain: Secondary | ICD-10-CM | POA: Diagnosis not present

## 2020-09-09 DIAGNOSIS — R928 Other abnormal and inconclusive findings on diagnostic imaging of breast: Secondary | ICD-10-CM | POA: Diagnosis not present

## 2020-09-09 DIAGNOSIS — C50912 Malignant neoplasm of unspecified site of left female breast: Secondary | ICD-10-CM | POA: Diagnosis not present

## 2020-09-09 DIAGNOSIS — N62 Hypertrophy of breast: Secondary | ICD-10-CM | POA: Diagnosis not present

## 2020-09-09 DIAGNOSIS — Z803 Family history of malignant neoplasm of breast: Secondary | ICD-10-CM | POA: Diagnosis not present

## 2020-09-09 HISTORY — PX: BREAST EXCISIONAL BIOPSY: SUR124

## 2020-09-09 HISTORY — PX: BREAST LUMPECTOMY WITH RADIOACTIVE SEED LOCALIZATION: SHX6424

## 2020-09-09 HISTORY — PX: BREAST LUMPECTOMY: SHX2

## 2020-09-09 SURGERY — BREAST LUMPECTOMY WITH RADIOACTIVE SEED LOCALIZATION
Anesthesia: General | Site: Breast | Laterality: Bilateral

## 2020-09-09 MED ORDER — TECHNETIUM TC 99M TILMANOCEPT KIT
1.0000 | PACK | Freq: Once | INTRAVENOUS | Status: AC | PRN
  Administered 2020-09-09: 1 via INTRADERMAL

## 2020-09-09 MED ORDER — MIDAZOLAM HCL 2 MG/2ML IJ SOLN
INTRAMUSCULAR | Status: AC
  Filled 2020-09-09: qty 2

## 2020-09-09 MED ORDER — MIDAZOLAM HCL 2 MG/2ML IJ SOLN
INTRAMUSCULAR | Status: AC
  Administered 2020-09-09: 2 mg via INTRAVENOUS
  Filled 2020-09-09: qty 2

## 2020-09-09 MED ORDER — ORAL CARE MOUTH RINSE: 15.0000 mL | Freq: Once | OROMUCOSAL | Status: AC

## 2020-09-09 MED ORDER — GABAPENTIN 300 MG PO CAPS
300.0000 mg | ORAL_CAPSULE | ORAL | Status: AC
  Administered 2020-09-09: 300 mg via ORAL
  Filled 2020-09-09: qty 1

## 2020-09-09 MED ORDER — FENTANYL CITRATE (PF) 100 MCG/2ML IJ SOLN
INTRAMUSCULAR | Status: DC | PRN
  Administered 2020-09-09 (×2): 50 ug via INTRAVENOUS

## 2020-09-09 MED ORDER — LACTATED RINGERS IV SOLN
INTRAVENOUS | Status: DC
Start: 2020-09-09 — End: 2020-09-09

## 2020-09-09 MED ORDER — ONDANSETRON HCL 4 MG/2ML IJ SOLN
INTRAMUSCULAR | Status: DC | PRN
  Administered 2020-09-09: 4 mg via INTRAVENOUS

## 2020-09-09 MED ORDER — SCOPOLAMINE 1 MG/3DAYS TD PT72: 1.0000 | MEDICATED_PATCH | TRANSDERMAL | Status: DC

## 2020-09-09 MED ORDER — LIDOCAINE 2% (20 MG/ML) 5 ML SYRINGE
INTRAMUSCULAR | Status: DC | PRN
  Administered 2020-09-09: 20 mg via INTRAVENOUS

## 2020-09-09 MED ORDER — BUPIVACAINE HCL (PF) 0.25 % IJ SOLN
INTRAMUSCULAR | Status: DC | PRN
  Administered 2020-09-09: 12.5 mL

## 2020-09-09 MED ORDER — BUPIVACAINE-EPINEPHRINE (PF) 0.5% -1:200000 IJ SOLN
INTRAMUSCULAR | Status: DC | PRN
  Administered 2020-09-09: 12.5 mL

## 2020-09-09 MED ORDER — CEFAZOLIN SODIUM-DEXTROSE 2-4 GM/100ML-% IV SOLN
2.0000 g | INTRAVENOUS | Status: AC
  Administered 2020-09-09: 2 g via INTRAVENOUS
  Filled 2020-09-09: qty 100

## 2020-09-09 MED ORDER — FENTANYL CITRATE (PF) 250 MCG/5ML IJ SOLN
INTRAMUSCULAR | Status: AC
  Filled 2020-09-09: qty 5

## 2020-09-09 MED ORDER — ACETAMINOPHEN 500 MG PO TABS
1000.0000 mg | ORAL_TABLET | ORAL | Status: AC
  Administered 2020-09-09: 1000 mg via ORAL
  Filled 2020-09-09: qty 2

## 2020-09-09 MED ORDER — MEPERIDINE HCL 25 MG/ML IJ SOLN: 6.2500 mg | INTRAMUSCULAR | Status: DC | PRN

## 2020-09-09 MED ORDER — CHLORHEXIDINE GLUCONATE CLOTH 2 % EX PADS: 6.0000 | MEDICATED_PAD | Freq: Once | CUTANEOUS | Status: DC

## 2020-09-09 MED ORDER — OXYCODONE HCL 5 MG PO TABS
5.0000 mg | ORAL_TABLET | Freq: Once | ORAL | Status: AC | PRN
  Administered 2020-09-09: 5 mg via ORAL

## 2020-09-09 MED ORDER — HYDROMORPHONE HCL 1 MG/ML IJ SOLN: 0.2500 mg | INTRAMUSCULAR | Status: DC | PRN

## 2020-09-09 MED ORDER — MIDAZOLAM HCL 2 MG/2ML IJ SOLN: 0.5000 mg | Freq: Once | INTRAMUSCULAR | Status: DC | PRN

## 2020-09-09 MED ORDER — DEXAMETHASONE SODIUM PHOSPHATE 10 MG/ML IJ SOLN
INTRAMUSCULAR | Status: DC | PRN
Start: 2020-09-09 — End: 2020-09-09
  Administered 2020-09-09: 5 mg via INTRAVENOUS

## 2020-09-09 MED ORDER — PHENYLEPHRINE HCL (PRESSORS) 10 MG/ML IV SOLN
INTRAVENOUS | Status: AC
  Filled 2020-09-09: qty 1

## 2020-09-09 MED ORDER — PHENYLEPHRINE HCL-NACL 20-0.9 MG/250ML-% IV SOLN
INTRAVENOUS | Status: DC | PRN
  Administered 2020-09-09: 50 ug/min via INTRAVENOUS

## 2020-09-09 MED ORDER — PROMETHAZINE HCL 25 MG/ML IJ SOLN: 6.2500 mg | INTRAMUSCULAR | Status: DC | PRN

## 2020-09-09 MED ORDER — OXYCODONE HCL 5 MG PO TABS
ORAL_TABLET | ORAL | Status: AC
  Filled 2020-09-09: qty 1

## 2020-09-09 MED ORDER — PROPOFOL 10 MG/ML IV BOLUS
INTRAVENOUS | Status: DC | PRN
  Administered 2020-09-09: 50 mg via INTRAVENOUS
  Administered 2020-09-09: 150 mg via INTRAVENOUS

## 2020-09-09 MED ORDER — PHENYLEPHRINE 40 MCG/ML (10ML) SYRINGE FOR IV PUSH (FOR BLOOD PRESSURE SUPPORT)
PREFILLED_SYRINGE | INTRAVENOUS | Status: DC | PRN
  Administered 2020-09-09 (×3): 80 ug via INTRAVENOUS

## 2020-09-09 MED ORDER — CELECOXIB 200 MG PO CAPS
200.0000 mg | ORAL_CAPSULE | ORAL | Status: AC
  Administered 2020-09-09: 200 mg via ORAL
  Filled 2020-09-09: qty 1

## 2020-09-09 MED ORDER — FENTANYL CITRATE (PF) 100 MCG/2ML IJ SOLN
INTRAMUSCULAR | Status: AC
  Administered 2020-09-09: 100 ug via INTRAVENOUS
  Filled 2020-09-09: qty 2

## 2020-09-09 MED ORDER — CHLORHEXIDINE GLUCONATE 0.12 % MT SOLN
15.0000 mL | Freq: Once | OROMUCOSAL | Status: AC
  Administered 2020-09-09: 15 mL via OROMUCOSAL
  Filled 2020-09-09: qty 15

## 2020-09-09 MED ORDER — FENTANYL CITRATE (PF) 100 MCG/2ML IJ SOLN: 100.0000 ug | Freq: Once | INTRAMUSCULAR | Status: AC

## 2020-09-09 MED ORDER — OXYCODONE HCL 5 MG/5ML PO SOLN
5.0000 mg | Freq: Once | ORAL | Status: AC | PRN
Start: 2020-09-09 — End: 2020-09-09

## 2020-09-09 MED ORDER — HYDROCODONE-ACETAMINOPHEN 5-325 MG PO TABS: 1.0000 | ORAL_TABLET | Freq: Four times a day (QID) | ORAL | 0 refills | Status: DC | PRN

## 2020-09-09 MED ORDER — 0.9 % SODIUM CHLORIDE (POUR BTL) OPTIME
TOPICAL | Status: DC | PRN
  Administered 2020-09-09: 1000 mL

## 2020-09-09 MED ORDER — ALBUMIN HUMAN 5 % IV SOLN: INTRAVENOUS | Status: DC | PRN

## 2020-09-09 MED ORDER — MIDAZOLAM HCL 5 MG/5ML IJ SOLN
INTRAMUSCULAR | Status: DC | PRN
  Administered 2020-09-09: 2 mg via INTRAVENOUS

## 2020-09-09 MED ORDER — BUPIVACAINE-EPINEPHRINE 0.25% -1:200000 IJ SOLN
INTRAMUSCULAR | Status: DC | PRN
  Administered 2020-09-09: 30 mL

## 2020-09-09 MED ORDER — MIDAZOLAM HCL 2 MG/2ML IJ SOLN: 2.0000 mg | Freq: Once | INTRAMUSCULAR | Status: AC

## 2020-09-09 MED ORDER — EPHEDRINE SULFATE-NACL 50-0.9 MG/10ML-% IV SOSY
PREFILLED_SYRINGE | INTRAVENOUS | Status: DC | PRN
  Administered 2020-09-09: 5 mg via INTRAVENOUS
  Administered 2020-09-09: 10 mg via INTRAVENOUS
  Administered 2020-09-09: 5 mg via INTRAVENOUS

## 2020-09-09 MED ORDER — BUPIVACAINE-EPINEPHRINE (PF) 0.25% -1:200000 IJ SOLN
INTRAMUSCULAR | Status: AC
  Filled 2020-09-09: qty 30

## 2020-09-09 MED ORDER — METHYLENE BLUE 0.5 % INJ SOLN
INTRAVENOUS | Status: AC
  Filled 2020-09-09: qty 10

## 2020-09-09 SURGICAL SUPPLY — 32 items
ADH SKN CLS APL DERMABOND .7 (GAUZE/BANDAGES/DRESSINGS) ×1
APL PRP STRL LF DISP 70% ISPRP (MISCELLANEOUS) ×1
APPLIER CLIP 9.375 MED OPEN (MISCELLANEOUS) ×2
APR CLP MED 9.3 20 MLT OPN (MISCELLANEOUS) ×1
BINDER BREAST LRG (GAUZE/BANDAGES/DRESSINGS) ×1 IMPLANT
CANISTER SUCT 3000ML PPV (MISCELLANEOUS) ×2 IMPLANT
CHLORAPREP W/TINT 26 (MISCELLANEOUS) ×2 IMPLANT
CLIP APPLIE 9.375 MED OPEN (MISCELLANEOUS) IMPLANT
COVER PROBE W GEL 5X96 (DRAPES) ×2 IMPLANT
COVER SURGICAL LIGHT HANDLE (MISCELLANEOUS) ×2 IMPLANT
DERMABOND ADVANCED (GAUZE/BANDAGES/DRESSINGS) ×1
DERMABOND ADVANCED .7 DNX12 (GAUZE/BANDAGES/DRESSINGS) ×1 IMPLANT
DEVICE DUBIN SPECIMEN MAMMOGRA (MISCELLANEOUS) ×2 IMPLANT
DRAPE CHEST BREAST 15X10 FENES (DRAPES) ×2 IMPLANT
ELECT COATED BLADE 2.86 ST (ELECTRODE) ×2 IMPLANT
ELECT REM PT RETURN 9FT ADLT (ELECTROSURGICAL) ×2
ELECTRODE REM PT RTRN 9FT ADLT (ELECTROSURGICAL) ×1 IMPLANT
GLOVE SURG ENC MOIS LTX SZ7.5 (GLOVE) ×4 IMPLANT
GOWN STRL REUS W/ TWL LRG LVL3 (GOWN DISPOSABLE) ×2 IMPLANT
GOWN STRL REUS W/TWL LRG LVL3 (GOWN DISPOSABLE) ×4
KIT BASIN OR (CUSTOM PROCEDURE TRAY) ×2 IMPLANT
KIT MARKER MARGIN INK (KITS) ×2 IMPLANT
NDL HYPO 25GX1X1/2 BEV (NEEDLE) ×1 IMPLANT
NEEDLE HYPO 25GX1X1/2 BEV (NEEDLE) ×2 IMPLANT
NS IRRIG 1000ML POUR BTL (IV SOLUTION) ×2 IMPLANT
PACK GENERAL/GYN (CUSTOM PROCEDURE TRAY) ×2 IMPLANT
SUT MNCRL AB 4-0 PS2 18 (SUTURE) ×4 IMPLANT
SUT SILK 2 0 SH (SUTURE) IMPLANT
SUT VIC AB 3-0 SH 18 (SUTURE) ×3 IMPLANT
SYR CONTROL 10ML LL (SYRINGE) ×2 IMPLANT
TOWEL GREEN STERILE (TOWEL DISPOSABLE) ×2 IMPLANT
TOWEL GREEN STERILE FF (TOWEL DISPOSABLE) ×2 IMPLANT

## 2020-09-09 NOTE — Op Note (Signed)
09/09/2020  5:29 PM  PATIENT:  Ottis Stain  45 y.o. female  PRE-OPERATIVE DIAGNOSIS:  left breast cancer, right breast pash  POST-OPERATIVE DIAGNOSIS:  left breast cancer, right breast pash  PROCEDURE:  Procedure(s): LEFT BREAST RADIOACTIVE SEED LOCALIZATION LUMPECTOMY WITH DEEP LEFT AXILLARY SENITNEL LYMPH NODE BIOPSY AND RIGHT BREAST RADIOACTIVE SEED LOCALIZATION LUMPECTOMY x2 (Bilateral)  SURGEON:  Surgeon(s) and Role:    * Jovita Kussmaul, MD - Primary  PHYSICIAN ASSISTANT:   ASSISTANTS: Dr. Adriana Reams   ANESTHESIA:   local and general  EBL:  minimal   BLOOD ADMINISTERED:none  DRAINS: none   LOCAL MEDICATIONS USED:  MARCAINE     SPECIMEN:  Source of Specimen:  right breast lumpectomy x 2, left breast tissue and sentinel node x 3  DISPOSITION OF SPECIMEN:  PATHOLOGY  COUNTS:  YES  TOURNIQUET:  * No tourniquets in log *  DICTATION: .Dragon Dictation  After informed consent was obtained the patient was brought to the operating room and placed in the supine position on the operating table.  After adequate induction of general anesthesia the patient's bilateral chest, breast, and axillary areas were prepped with ChloraPrep, allowed to dry, and draped in usual sterile manner.  An appropriate timeout was performed.  Previously 2 I-125 seeds were placed in the upper and lower portion of the right breast to mark areas of pseudo angiomatous stromal hyperplasia.  The patient also had an I-125 seed placed in the outer aspect of the left breast to mark an area of invasive breast cancer.  Earlier in the day the patient also underwent injection 1 mCi of technetium sulfur colloid in the subareolar position on the left.  Attention was first turned to the right breast.  The neoprobe was set to I-125 in the area of radioactivity was first readily identified in the upper portion of the right breast.  The area around this was infiltrated with quarter percent Marcaine.  A curvilinear incision was  made along the upper edge of the areola of the right breast with a 15 blade knife.  The incision was carried through the skin and subcutaneous tissue sharply with the electrocautery.  Dissection was then carried throughout the upper portion of the right breast between the breast tissue and the subcutaneous fat and skin.  Once this dissection was well beyond the area of the radioactive seed I then removed a circular portion of breast tissue sharply with the electrocautery around the radioactive seed while checking the area of radioactivity frequently.  Once the specimen was removed it was oriented with the appropriate paint colors.  A specimen radiograph was obtained that showed the clip and seed to be near the center of the specimen.  The specimen was then sent to pathology for further evaluation.  Hemostasis was achieved using the Bovie electrocautery.  The deep layer of the wound was then closed with interrupted 3-0 Vicryl stitches.  The skin was then closed with interrupted 4-0 Monocryl subcuticular stitches.  Attention was then turned to the area in the right breast in the lower inner quadrant.  The area around this was infiltrated with quarter percent Marcaine.  A curvilinear incision was made along the inframammary fold of the right breast nearest to the radioactivity.  The incision was carried through the skin and subcutaneous tissue sharply with the electrocautery until the dissection reached the chest wall.  The dissection was then carried out superiorly between the breast tissue and the chest wall muscle until we were well beyond  the area of radioactivity.  I then removed a wedge of breast tissue sharply with the electrocautery around the radioactive seed while checking the area of radioactivity frequently.  Once the specimen was removed it was oriented with the appropriate paint colors.  A specimen radiograph was obtained that showed the clip and seed to be near the center of the specimen.  Specimen was  then sent to pathology for further evaluation.  Hemostasis was achieved using the Bovie electrocautery.  The wound was then closed with layers of interrupted 3-0 Vicryl stitches.  The skin was closed with a running 4-0 Monocryl subcuticular stitch.  Attention was then turned to the left breast.  The area of radioactivity was found with the neoprobe set to I-125 in the lateral aspect of the breast.  To best access this area I elected to make a crescent shaped incision in the outer aspect of the left breast with a 15 blade knife.  The incision was carried through the skin and subcutaneous tissue sharply with the electrocautery.  Dissection was then carried widely around the radioactive seed while checking the area of radioactivity frequently.  This dissection was carried all the way to the muscle of the chest wall.  Once the specimen was removed it was oriented with the appropriate paint colors.  A specimen radiograph was obtained that showed the clip and seed to be near the center of the specimen.  The specimen was then sent to pathology for further evaluation.  Hemostasis was achieved using the Bovie electrocautery.  The deep layer of the wound was then closed with layers of interrupted 3-0 Vicryl stitches.  The skin was then closed with a running 4-0 Monocryl subcuticular stitch.  The neoprobe was then set to technetium and an area of radioactivity was readily identified fairly high in the left axilla.  This area was infiltrated with quarter percent Marcaine.  A small transversely oriented incision was made with a 15 blade knife.  The incision was carried through the skin and subcutaneous tissue sharply with the electrocautery until the deep left axillary space was entered.  Dissection was carried out bluntly with a hemostat under the direction of the neoprobe.  I was able to identify 3 hot lymph nodes.  These were excised sharply with the electrocautery and the surrounding small vessels and lymphatics were  controlled with clips.  Ex vivo counts on these nodes ranged from 50 to 300.  No other hot or palpable nodes were identified in the left axilla.  Hemostasis was achieved using the Bovie electrocautery.  The deep layer of the incision was closed with interrupted 3-0 Vicryl stitches.  The skin was then closed with a running 4-0 Monocryl subcuticular stitch.  Dermabond dressings were applied.  The patient tolerated the procedure well.  At the end of the case all needle sponge and instrument counts were correct.  The patient was then awakened and taken recovery in stable condition.I was personally present during the key and critical portions of this procedure and immediately available throughout the entire procedure, as documented in my operative note.   PLAN OF CARE: Discharge to home after PACU  PATIENT DISPOSITION:  PACU - hemodynamically stable.   Delay start of Pharmacological VTE agent (>24hrs) due to surgical blood loss or risk of bleeding: not applicable

## 2020-09-09 NOTE — Anesthesia Procedure Notes (Signed)
Anesthesia Regional Block: Pectoralis block   Pre-Anesthetic Checklist: , timeout performed,  Correct Patient, Correct Site, Correct Laterality,  Correct Procedure, Correct Position, site marked,  Risks and benefits discussed,  Surgical consent,  Pre-op evaluation,  At surgeon's request and post-op pain management  Laterality: Left  Prep: chloraprep       Needles:  Injection technique: Single-shot  Needle Type: Echogenic Needle     Needle Length: 9cm  Needle Gauge: 21     Additional Needles:   Procedures:,,,, ultrasound used (permanent image in chart),,    Narrative:  Start time: 09/09/2020 3:07 PM End time: 09/09/2020 3:14 PM Injection made incrementally with aspirations every 5 mL.  Performed by: Personally  Anesthesiologist: Annye Asa, MD  Additional Notes: Pt identified in Holding room.  Monitors applied. Working IV access confirmed. Sterile prep L clavicle and pec.  #21ga ECHOgenic Arrow block needle between pec minor and serratus, ribs 4,5 with US guidance.  25cc 0.375% Bupivacaine with 1:300k epi injected incrementally after negative test dose.  Patient asymptomatic, VSS, no heme aspirated, tolerated well.   Jenita Seashore, MD

## 2020-09-09 NOTE — Interval H&P Note (Signed)
History and Physical Interval Note:  09/09/2020 2:42 PM  Rachael Garza  has presented today for surgery, with the diagnosis of left breast cancer.  The various methods of treatment have been discussed with the patient and family. After consideration of risks, benefits and other options for treatment, the patient has consented to  Procedure(s) with comments: LEFT BREAST RADIOACTIVE SEED LOCALIZATION LUMPECTOMY WITH SENITNEL LYMPH NODE, AND RIGHT RADIOACTIVE SEED LOCALIZATION LUMPECTOMY x2 (N/A) - PECTORAL BLOCK as a surgical intervention.  The patient's history has been reviewed, patient examined, no change in status, stable for surgery.  I have reviewed the patient's chart and labs.  Questions were answered to the patient's satisfaction.     [name]

## 2020-09-09 NOTE — Anesthesia Postprocedure Evaluation (Signed)
Anesthesia Post Note  Patient: Rachael Garza  Procedure(s) Performed: LEFT BREAST RADIOACTIVE SEED LOCALIZATION LUMPECTOMY WITH SENITNEL LYMPH NODE, AND RIGHT RADIOACTIVE SEED LOCALIZATION LUMPECTOMY x2 (Bilateral: Breast)     Patient location during evaluation: PACU Anesthesia Type: General Level of consciousness: awake and alert, patient cooperative and oriented Pain management: pain level controlled Vital Signs Assessment: post-procedure vital signs reviewed and stable Respiratory status: spontaneous breathing, nonlabored ventilation and respiratory function stable Cardiovascular status: blood pressure returned to baseline and stable Postop Assessment: no apparent nausea or vomiting Anesthetic complications: no   No notable events documented.  Last Vitals:  Vitals:   09/09/20 1755 09/09/20 1810  BP: 112/64 104/63  Pulse: 96 77  Resp: 20 (!) 33  Temp:    SpO2: 100% 99%    Last Pain:  Vitals:   09/09/20 1807  TempSrc:   PainSc: 4                  De Jaworski,E. Jasaun Carn

## 2020-09-09 NOTE — Anesthesia Procedure Notes (Signed)
Procedure Name: LMA Insertion Date/Time: 09/09/2020 3:38 PM Performed by: Georgia Duff, CRNA Pre-anesthesia Checklist: Patient identified, Emergency Drugs available, Suction available and Patient being monitored Patient Re-evaluated:Patient Re-evaluated prior to induction Oxygen Delivery Method: Circle System Utilized Preoxygenation: Pre-oxygenation with 100% oxygen Induction Type: IV induction Ventilation: Mask ventilation without difficulty LMA: LMA inserted LMA Size: 4.0 Number of attempts: 1 Airway Equipment and Method: Bite block Placement Confirmation: positive ETCO2 Tube secured with: Tape Dental Injury: Teeth and Oropharynx as per pre-operative assessment

## 2020-09-09 NOTE — Transfer of Care (Signed)
Immediate Anesthesia Transfer of Care Note  Patient: Rachael Garza  Procedure(s) Performed: LEFT BREAST RADIOACTIVE SEED LOCALIZATION LUMPECTOMY WITH SENITNEL LYMPH NODE, AND RIGHT RADIOACTIVE SEED LOCALIZATION LUMPECTOMY x2 (Bilateral: Breast)  Patient Location: PACU  Anesthesia Type:General  Level of Consciousness: awake, alert  and drowsy  Airway & Oxygen Therapy: Patient Spontanous Breathing  Post-op Assessment: Report given to RN and Post -op Vital signs reviewed and stable  Post vital signs: Reviewed and stable  Last Vitals:  Vitals Value Taken Time  BP 93/68 09/09/20 1740  Temp    Pulse 90 09/09/20 1741  Resp 11 09/09/20 1739  SpO2 100 % 09/09/20 1741  Vitals shown include unvalidated device data.  Last Pain:  Vitals:   09/09/20 1510  TempSrc:   PainSc: 0-No pain      Patients Stated Pain Goal: 0 ([id number])  Complications: No notable events documented.

## 2020-09-09 NOTE — H&P (Signed)
Rachael Garza  Location: [facility] Patient #: [phone] DOB: 07-04-1975 Married / Language: English / Race: White Female   History of Present Illness  The patient is a 45 year old female who presents with breast cancer.We are asked to see the patient in consultation by Dr. Lindi Adie to evaluate her for a new left breast cancer. The patient is a 45 year old white female who presents with a palpable mass in the UOQ of the left breast for the last 2 months. She had a neg mammogram in feb. The mass measured 2.9cm by u/s and 3.2cm by Mri. The axilla looked neg. The mri also showed a 3.7cm area of enhancement in the LIQ of the right breast and 1.2cm enhancement in the UIQ of the right breast that is scheduled for biopsy tomorrow. The cancer is a grade 2 IDC that was ER and PR+ and Her2 - with a Ki67 of 15%. She is otherwise healthy and does not smoke.   Past Surgical History  Breast Biopsy   Left. Oral Surgery    Diagnostic Studies History  Colonoscopy   never Mammogram   within last year Pap Smear   1-5 years ago  Medication History  Medications Reconciled   Social History No caffeine use   No drug use    Family History  Arthritis   Father. Breast Cancer   Family Members In General. Colon Cancer   Family Members In General. Hypertension   Father.  Pregnancy / Birth History Age at menarche   54 years. Contraceptive History   Intrauterine device, Oral contraceptives. Gravida   3 Length (months) of breastfeeding   12-24 Maternal age   38-25 Para   2 Regular periods    Other Problems  Anxiety Disorder   Migraine Headache      Review of Systems  General Present- Appetite Loss, Night Sweats and Weight Loss. Not Present- Chills, Fatigue, Fever and Weight Gain. Skin Not Present- Change in Wart/Mole, Dryness, Hives, Jaundice, New Lesions, Non-Healing Wounds, Rash and Ulcer. HEENT Present- Visual Disturbances. Not Present- Earache, Hearing Loss, Hoarseness, Nose  Bleed, Oral Ulcers, Ringing in the Ears, Seasonal Allergies, Sinus Pain, Sore Throat, Wears glasses/contact lenses and Yellow Eyes. Respiratory Not Present- Bloody sputum, Chronic Cough, Difficulty Breathing, Snoring and Wheezing. Breast Present- Breast Mass. Not Present- Breast Pain, Nipple Discharge and Skin Changes. Cardiovascular Not Present- Chest Pain, Difficulty Breathing Lying Down, Leg Cramps, Palpitations, Rapid Heart Rate, Shortness of Breath and Swelling of Extremities. Gastrointestinal Not Present- Abdominal Pain, Bloating, Bloody Stool, Change in Bowel Habits, Chronic diarrhea, Constipation, Difficulty Swallowing, Excessive gas, Gets full quickly at meals, Hemorrhoids, Indigestion, Nausea, Rectal Pain and Vomiting. Female Genitourinary Not Present- Frequency, Nocturia, Painful Urination, Pelvic Pain and Urgency. Musculoskeletal Not Present- Back Pain, Joint Pain, Joint Stiffness, Muscle Pain, Muscle Weakness and Swelling of Extremities. Neurological Present- Headaches. Not Present- Decreased Memory, Fainting, Numbness, Seizures, Tingling, Tremor, Trouble walking and Weakness. Psychiatric Present- Depression. Not Present- Anxiety, Bipolar, Change in Sleep Pattern, Fearful and Frequent crying. Endocrine Not Present- Cold Intolerance, Excessive Hunger, Hair Changes, Heat Intolerance, Hot flashes and New Diabetes. Hematology Not Present- Blood Thinners, Easy Bruising, Excessive bleeding, Gland problems, HIV and Persistent Infections.   Physical Exam  General Mental Status - Alert. General Appearance - Consistent with stated age. Hydration - Well hydrated. Voice - Normal.  Head and Neck Head - normocephalic, atraumatic with no lesions or palpable masses. Trachea - midline. Thyroid Gland Characteristics - normal size and consistency.  Eye Eyeball - Bilateral - Extraocular movements intact. Sclera/Conjunctiva - Bilateral - No scleral icterus.  Chest and Lung Exam Chest and lung  exam reveals  - quiet, even and easy respiratory effort with no use of accessory muscles and on auscultation, normal breath sounds, no adventitious sounds and normal vocal resonance. Inspection Chest Wall - Normal. Back - normal.  Breast Note:  There is symmetric dense breast tissue bilaterally. There is a 2cm palpable mass that is mobile in the UOQ of the left breast with no overlying skin changes. There is no palpable axillary, supraclavicular, or cervical lymphadenopathy   Cardiovascular Cardiovascular examination reveals  - normal heart sounds, regular rate and rhythm with no murmurs and normal pedal pulses bilaterally.  Abdomen Inspection Inspection of the abdomen reveals - No Hernias. Skin - Scar - no surgical scars. Palpation/Percussion Palpation and Percussion of the abdomen reveal - Soft, Non Tender, No Rebound tenderness, No Rigidity (guarding) and No hepatosplenomegaly. Auscultation Auscultation of the abdomen reveals - Bowel sounds normal.  Neurologic Neurologic evaluation reveals  - alert and oriented x 3 with no impairment of recent or remote memory. Mental Status - Normal.  Musculoskeletal Normal Exam - Left - Upper Extremity Strength Normal and Lower Extremity Strength Normal. Normal Exam - Right - Upper Extremity Strength Normal and Lower Extremity Strength Normal.  Lymphatic Head & Neck  General Head & Neck Lymphatics: Bilateral - Description - Normal. Axillary  General Axillary Region: Bilateral - Description - Normal. Tenderness - Non Tender. Femoral & Inguinal  Generalized Femoral & Inguinal Lymphatics: Bilateral - Description - Normal. Tenderness - Non Tender.    Assessment & Plan  MALIGNANT NEOPLASM OF UPPER-OUTER QUADRANT OF LEFT BREAST IN FEMALE, ESTROGEN RECEPTOR POSITIVE (C50.412) Impression: The patient appears to have a 3.2cm cancer in the UOQ of the left breast with clinically neg lymph nodes. The patient [place] undecided between lumpectomy and  mastectomy. She is a good candidate for sentinel node biopsy. She is scheduled for right breast biopsy x 2 tomorrow. We will call her with the results. If these are positive then she will likely favor bilateral mastectomy. I will refer her to Dr. Marla Roe for reconstruction options. We will likely run oncotype on biopsy to see if she will need chemo in which case she may benefit from getting this neoadjuvantly. She will meet with medical and radiation oncology to discuss adjuvant/neo treatment. This patient encounter took 60 minutes today to perform the following: take history, perform exam, review outside records, interpret imaging, counsel the patient on their diagnosis and document encounter, findings & plan in the EHR Current Plans Referred to Oncology, for evaluation and follow up (Oncology). Routine. Referred to Surgery - Plastic, for evaluation and follow up (Plastic Surgery). Routine.

## 2020-09-10 ENCOUNTER — Encounter (HOSPITAL_COMMUNITY): Payer: Self-pay | Admitting: General Surgery

## 2020-09-12 LAB — SURGICAL PATHOLOGY

## 2020-09-15 ENCOUNTER — Encounter: Payer: Self-pay | Admitting: *Deleted

## 2020-09-21 NOTE — Progress Notes (Signed)
Patient Care Team: Creola Corn, MD as PCP - General (Internal Medicine) Donnelly Angelica, RN as Oncology Nurse Navigator Pershing Proud, RN as Oncology Nurse Navigator Antony Blackbird, MD as Consulting Physician (Radiation Oncology) Serena Croissant, MD as Consulting Physician (Hematology and Oncology) Griselda Miner, MD as Consulting Physician (General Surgery)  DIAGNOSIS:    ICD-10-CM   1. Malignant neoplasm of upper-outer quadrant of left breast in female, estrogen receptor positive (HCC)  C50.412    Z17.0       SUMMARY OF ONCOLOGIC HISTORY: Oncology History  Malignant neoplasm of upper-outer quadrant of left breast in female, estrogen receptor positive (HCC)  07/29/2020 Initial Diagnosis    Palpable left breast mass (very dense breasts), Mammogram revealed 2.9 cm masss, MRI 3.2 cm mass: Biopsy Grade 2 IDC ER 95%, PR 95%, Her 2 Neg, Ki 67 15% MRI on Right breast: 3.7 cm NME LIQ and 1.2 cm NME UIQ: Needs biopsies   08/06/2020 Cancer Staging   Staging form: Breast, AJCC 8th Edition - Clinical stage from 08/06/2020: Stage IB (cT2, cN0, cM0, G2, ER+, PR+, HER2-) - Signed by Serena Croissant, MD on 08/06/2020 Stage prefix: Initial diagnosis Histologic grading system: 3 grade system Laterality: Left Staged by: Pathologist and managing physician Stage used in treatment planning: Yes National guidelines used in treatment planning: Yes Type of national guideline used in treatment planning: NCCN   08/13/2020 Genetic Testing   No pathogenic variants detected in [facility] BRCAPlus Panel or CancerNext-Expanded +RNAinsight Panel.  Variant of uncertain significance detected in BLM at  p.A914T (c.2740G>A).  The report dates are August 13, 2020 and August 18, 2020, respectively.    The BRCAplus panel offered by [employer] [facility] and includes sequencing and deletion/duplication analysis for the following 8 genes: ATM, BRCA1, BRCA2, CDH1, CHEK2, PALB2, PTEN, and TP53.   The CancerNext-Expanded gene panel offered by  [facility] and includes sequencing, rearrangement, and RNA analysis for the following 77 genes: AIP, ALK, APC, ATM, AXIN2, BAP1, BARD1, BLM, BMPR1A, BRCA1, BRCA2, BRIP1, CDC73, CDH1, CDK4, CDKN1B, CDKN2A, CHEK2, CTNNA1, DICER1, FANCC, FH, FLCN, GALNT12, KIF1B, LZTR1, MAX, MEN1, MET, MLH1, MSH2, MSH3, MSH6, MUTYH, NBN, NF1, NF2, NTHL1, PALB2, PHOX2B, PMS2, POT1, PRKAR1A, PTCH1, PTEN, RAD51C, RAD51D, RB1, RECQL, RET, SDHA, SDHAF2, SDHB, SDHC, SDHD, SMAD4, SMARCA4, SMARCB1, SMARCE1, STK11, SUFU, TMEM127, TP53, TSC1, TSC2, VHL and XRCC2 (sequencing and deletion/duplication); EGFR, EGLN1, HOXB13, KIT, MITF, PDGFRA, POLD1, and POLE (sequencing only); EPCAM and GREM1 (deletion/duplication only).    09/09/2020 Surgery   Right lumpectomy: PASH Right lumpectomy 2.: PASH Left lumpectomy: Grade 2 IDC 2.9 cm, intermediate grade DCIS, margins negative, 0/3 lymph nodes negative, ER 95%, PR 95%, HER2 equal vocal FISH negative, Ki-67 15%     CHIEF COMPLIANT: Follow-up of left breast cancer  INTERVAL HISTORY: Rachael Garza is a 45 y.o. with above-mentioned history of left breast cancer. She presents to the clinic today for follow-up.   ALLERGIES:  is allergic to caffeine.  MEDICATIONS:  Current Outpatient Medications  Medication Sig Dispense Refill   escitalopram (LEXAPRO) 5 MG tablet Take 10 mg by mouth daily.     HYDROcodone-acetaminophen (NORCO/VICODIN) 5-325 MG tablet Take 1-2 tablets by mouth every 6 (six) hours as needed for moderate pain or severe pain. 15 tablet 0   levonorgestrel (MIRENA, 52 MG,) 20 MCG/24HR IUD Mirena 20 mcg/24 hours (7 yrs) 52 mg intrauterine device  Take 1 device every day by intrauterine route as directed.     melatonin 3 MG TABS tablet Take  3 mg by mouth at bedtime as needed (sleep).     No current facility-administered medications for this visit.    PHYSICAL EXAMINATION: ECOG PERFORMANCE STATUS: 1 - Symptomatic but completely ambulatory  Vitals:   09/22/20 1525  BP:  108/71  Pulse: 85  Resp: 18  Temp: 97.7 F (36.5 C)  SpO2: 100%   Filed Weights   09/22/20 1525  Weight: 123 lb 12.8 oz (56.2 kg)    BREAST: No palpable masses or nodules in either right or left breasts. No palpable axillary supraclavicular or infraclavicular adenopathy no breast tenderness or nipple discharge. (exam performed in the presence of a chaperone)  LABORATORY DATA:  I have reviewed the data as listed CMP Latest Ref Rng & Units 08/06/2020  Glucose 70 - 99 mg/dL 163(H)  BUN 6 - 20 mg/dL 15  Creatinine 0.44 - 1.00 mg/dL 0.74  Sodium 135 - 145 mmol/L 138  Potassium 3.5 - 5.1 mmol/L 3.5  Chloride 98 - 111 mmol/L 103  CO2 22 - 32 mmol/L 26  Calcium 8.9 - 10.3 mg/dL 9.0  Total Protein 6.5 - 8.1 g/dL 7.1  Total Bilirubin 0.3 - 1.2 mg/dL 0.8  Alkaline Phos 38 - 126 U/L 28(L)  AST 15 - 41 U/L 15  ALT 0 - 44 U/L 10    Lab Results  Component Value Date   WBC 6.5 09/05/2020   HGB 13.9 09/05/2020   HCT 41.9 09/05/2020   MCV 91.3 09/05/2020   PLT 289 09/05/2020   NEUTROABS 3.9 08/06/2020    ASSESSMENT & PLAN:  Malignant neoplasm of upper-outer quadrant of left breast in female, estrogen receptor positive ([place]) 09/09/2020: Right lumpectomy: PASH, Right lumpectomy 2.: PASH Left lumpectomy: Grade 2 IDC 2.9 cm, intermediate grade DCIS, margins negative, 0/3 lymph nodes negative, ER 95%, PR 95%, HER2 equal vocal FISH negative, Ki-67 15% Oncotype DX: Score 15, distant recurrence at 9 years: 4%  Pathology counseling: I discussed the final pathology report of the patient provided  a copy of this report. I discussed the margins as well as lymph node surgeries. We also discussed the final staging along with previously performed ER/PR and HER-2/neu testing.  Treatment plan: 1.  Adjuvant radiation 2. followed by adjuvant antiestrogen therapy with tamoxifen 20 mg daily x10 years  Anxiety: She was on Lexapro.  I recommended switching her to Effexor.  I instructed her to take Lexapro  half a tablet daily and transition to Effexor. I sent a prescription for Effexor today.  Mirena IUD: We discussed this in detail.  We decided to request her gynecologist to remove the Mirena and switch her to copper IUD.  Return to clinic after radiation is complete to start tamoxifen.   No orders of the defined types were placed in this encounter.  The patient has a good understanding of the overall plan. she agrees with it. she will call with any problems that may develop before the next visit here.  Total time spent: 30 mins including face to face time and time spent for planning, charting and coordination of care  Rulon Eisenmenger, MD, MPH 09/22/2020  I, Thana Ates, am acting as scribe for Dr. Nicholas Lose.  I have reviewed the above documentation for accuracy and completeness, and I agree with the above.

## 2020-09-22 ENCOUNTER — Inpatient Hospital Stay: Payer: BC Managed Care – PPO | Attending: Hematology and Oncology | Admitting: Hematology and Oncology

## 2020-09-22 ENCOUNTER — Other Ambulatory Visit: Payer: Self-pay

## 2020-09-22 DIAGNOSIS — Z79899 Other long term (current) drug therapy: Secondary | ICD-10-CM | POA: Insufficient documentation

## 2020-09-22 DIAGNOSIS — C50412 Malignant neoplasm of upper-outer quadrant of left female breast: Secondary | ICD-10-CM | POA: Diagnosis not present

## 2020-09-22 DIAGNOSIS — Z170 Estrogen receptor positive status [ER+]: Secondary | ICD-10-CM | POA: Diagnosis not present

## 2020-09-22 DIAGNOSIS — F419 Anxiety disorder, unspecified: Secondary | ICD-10-CM | POA: Insufficient documentation

## 2020-09-22 MED ORDER — VENLAFAXINE HCL ER 37.5 MG PO CP24: 37.5000 mg | ORAL_CAPSULE | Freq: Every day | ORAL | 6 refills | Status: DC

## 2020-09-22 NOTE — Assessment & Plan Note (Signed)
09/09/2020: Right lumpectomy: PASH, Right lumpectomy 2.: PASH Left lumpectomy: Grade 2 IDC 2.9 cm, intermediate grade DCIS, margins negative, 0/3 lymph nodes negative, ER 95%, PR 95%, HER2 equal vocal FISH negative, Ki-67 15% Oncotype DX: Score 15, distant recurrence at 9 years: 4%  Pathology counseling: I discussed the final pathology report of the patient provided  a copy of this report. I discussed the margins as well as lymph node surgeries. We also discussed the final staging along with previously performed ER/PR and HER-2/neu testing.  Treatment plan: 1.  Adjuvant radiation 2. followed by adjuvant antiestrogen therapy with tamoxifen 20 mg daily x10 years  Return to clinic after radiation is complete to start tamoxifen.  Treatment plan:

## 2020-10-01 NOTE — Progress Notes (Signed)
Location of Breast Cancer: upper-outer quadrant of left breast  Histology per Pathology Report: Biopsy Grade 2 IDC    Receptor Status: ER 95%, PR 95%, Her 2 Neg, Ki 67 15%  Did patient present with symptoms (if so, please note symptoms) or was this found on screening mammography?: screening mammogram  Past/Anticipated interventions by surgeon, if any:  PROCEDURE:   LEFT BREAST RADIOACTIVE SEED LOCALIZATION LUMPECTOMY WITH DEEP LEFT AXILLARY SENITNEL LYMPH NODE BIOPSY AND RIGHT BREAST RADIOACTIVE SEED LOCALIZATION LUMPECTOMY x2 (Bilateral)   SURGEON:  Surgeon(s) and Role:    Jovita Kussmaul, MD - Primary  Past/Anticipated interventions by medical oncology, if any: Dr Lindi Adie  Lymphedema issues, if any:  no    Pain issues, if any:  no   SAFETY ISSUES: Prior radiation? no Pacemaker/ICD? no Possible current pregnancy?no, IUD Is the patient on methotrexate? no  Current Complaints / other details:  none    Vitals:   10/06/20 0921  BP: 111/70  Pulse: 61  Resp: 18  Temp: 98 F (36.7 C)  SpO2: 100%  Weight: 125 lb 4 oz (56.8 kg)  Height: '5\' 6"'$  (1.676 m)

## 2020-10-02 NOTE — Telephone Encounter (Signed)
Error

## 2020-10-03 NOTE — Progress Notes (Signed)
Radiation Oncology         [phone] ________________________________  Name: Rachael Garza MRN: [id number]  Date: 10/06/2020  DOB: 03/13/1975  Re-Evaluation Note  CC: Shon Baton, MD  Nicholas Lose, MD    ICD-10-CM   1. Malignant neoplasm of upper-outer quadrant of left breast in female, estrogen receptor positive ([place])  C50.412    Z17.0       Diagnosis:   Stage IA (pT2, pN0, cM0) Left Breast UOQ, Invasive Ductal Carcinoma with DCIS, ER+ / PR+ / Her2-, Grade 2  Narrative:  The patient returns today to discuss radiation treatment options. She was seen in the multidisciplinary breast clinic on 08/06/20.   Since consultation, she underwent genetic testing on 08/06/20. Results received on 08/07/20 were negative; with no clinically significant variants detected. Additional specimen collected revealed a variant of unknown significance detected.  Oncotype DX was additionally ordered revealing the recurrence score of 15 predicting a risk of recurrence outside the breast over the next 9 years of 4%, if the patient's only systemic therapy is an antiestrogen for 5 years.  It also predicts a 1.6% benefit from chemotherapy.  She opted to proceed with bilateral breast lumpectomies with left sentinel nodal biopsies on 09/09/20 under the care of Dr. Marlou Starks. Pathology from left breast lumpectomy revealed: grade 2 invasive ductal carcinoma spanning 2.9 cm; with intermediate grade ductal carcinoma in situ. Invasive carcinoma 0.3 cm to the medial margin focally; and in-situ carcinoma 0.2 cm to the medial margin focally. 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 All 3 left axillary sentinel nodes were otherwise negative for carcinoma. Prognostic indicators significant for: ER: 95% positive, PR: 95% positive, both with strong staining intensity; Her2: negative; Ki67: 15%; Grade 2.   The patient  most recently followed up with Dr. Lindi Adie on 09/22/20. During which time Dr. Lindi Adie recommended for the patient to start a course of adjuvant antiestrogen therapy with tamoxifen following adjuvant radiation therapy.  On review of systems, the patient reports minimal discomfort in the left and right breast. She denies range of motion issues or swelling in her left arm or hand.  Allergies:  is allergic to caffeine.  Meds: Current Outpatient Medications  Medication Sig Dispense Refill   escitalopram (LEXAPRO) 10 MG tablet Take 10 mg by mouth daily.     levonorgestrel (MIRENA, 52 MG,) 20 MCG/24HR IUD Mirena 20 mcg/24 hours (7 yrs) 52 mg intrauterine device  Take 1 device every day by intrauterine route as directed.     melatonin 3 MG TABS tablet Take 3 mg by mouth at bedtime as needed (sleep).     venlafaxine XR (EFFEXOR-XR) 37.5 MG 24 hr capsule Take 1 capsule (37.5 mg total) by mouth daily with breakfast. (Patient not taking: Reported on 10/06/2020) 30 capsule 6   No current facility-administered medications for this encounter.    Physical Findings: The patient is in no acute distress. Patient is alert and oriented.  height is $RemoveB'5\' 6"'uXGyTXHD$  (1.676 m) and weight is 125 lb 4 oz (56.8 kg). Her temperature is 98 F (36.7 C). Her blood pressure is 111/70 and her pulse is 61. Her respiration is 18 and oxygen saturation is 100%.  No significant changes. Lungs are clear to auscultation bilaterally. Heart has regular rate and rhythm. No palpable cervical, supraclavicular, or axillary adenopathy. Abdomen soft, non-tender, normal bowel sounds. Right breast: no palpable mass, nipple discharge or bleeding.  2 separate scars noted in  the breast, both healing well Left breast: Long vertical scar in the lateral aspect of the breast which is healing well without signs of drainage or infection.  Lab Findings: Lab Results  Component Value Date   WBC 6.5 09/05/2020   HGB 13.9 09/05/2020   HCT 41.9 09/05/2020   MCV  91.3 09/05/2020   PLT 289 09/05/2020    Radiographic Findings: NM Sentinel Node Inj-No Rpt (Breast)  Result Date: 09/09/2020 Sulfur Colloid was injected by the Nuclear Medicine Technologist for sentinel lymph node localization.   MM Breast Surgical Specimen  Result Date: 09/09/2020 CLINICAL DATA:  Evaluate surgical specimen following lumpectomy for LEFT breast cancer. EXAM: SPECIMEN RADIOGRAPH OF THE LEFT BREAST COMPARISON:  Previous exam(s). FINDINGS: Status post excision of the LEFT breast. The radioactive seed and RIBBON biopsy marker clip are present, completely intact, and were marked for pathology. IMPRESSION: Specimen radiograph of the LEFT breast. Electronically Signed   By: Margarette Canada M.D.   On: 09/09/2020 16:54  MM Breast Surgical Specimen  Result Date: 09/09/2020 CLINICAL DATA:  Evaluate surgical specimen following RIGHT breast excision. EXAM: SPECIMEN RADIOGRAPH OF THE RIGHT BREAST COMPARISON:  Previous exam(s). FINDINGS: Status post excision of the right breast. The radioactive seed and CYLINDER biopsy marker clip are present, completely intact, and were marked for pathology. IMPRESSION: Specimen radiograph of the right breast. Electronically Signed   By: Margarette Canada M.D.   On: 09/09/2020 16:29  MM Breast Surgical Specimen  Result Date: 09/09/2020 CLINICAL DATA:  Evaluate surgical specimen following RIGHT breast excision. EXAM: SPECIMEN RADIOGRAPH OF THE RIGHT BREAST COMPARISON:  Previous exam(s). FINDINGS: Status post excision of the RIGHT breast. The radioactive seed and BARBELL biopsy marker clip are present, completely intact, and were marked for pathology. IMPRESSION: Specimen radiograph of the RIGHT breast. Electronically Signed   By: Margarette Canada M.D.   On: 09/09/2020 16:13  MM LT RADIOACTIVE SEED LOC MAMMO GUIDE  Result Date: 09/09/2020 CLINICAL DATA:  45 year old female for radioactive seed localization of LEFT breast cancer prior to lumpectomy and radioactive seed  localizations of 2 areas of biopsy-proven PASH/MR enhancement within the RIGHT breast prior to RIGHT breast excisions. EXAM: MAMMOGRAPHIC GUIDED RADIOACTIVE SEED LOCALIZATION OF THE LEFT BREAST MAMMOGRAPHIC GUIDED RADIOACTIVE SEED LOCALIZATION OF THE RIGHT BREAST X 2 COMPARISON:  Previous exam(s). FINDINGS: Patient presents for radioactive seed localization prior to LEFT lumpectomy and 2 RIGHT breast surgical excisions. I met with the patient and we discussed the procedures of seed localization including benefits and alternatives. We discussed the high likelihood of successful procedures. We discussed the risks of the procedures including infection, bleeding, tissue injury and further surgery. We discussed the low dose of radioactivity involved in the procedures. Informed, written consent was given. The usual time-out protocol was performed immediately prior to the procedures. MAMMOGRAPHIC GUIDED RADIOACTIVE SEED LOCALIZATION OF THE LEFT BREAST (carcinoma-RIBBON clip): Using mammographic guidance, sterile technique, 1% lidocaine and an I-125 radioactive seed, the RIBBON clip was localized using a LATERAL approach. The follow-up mammogram images confirm the seed in the expected location and were marked for Dr. Marlou Starks. Follow-up survey of the patient confirms presence of the radioactive seed. Order number of I-125 seed:  [id number]. Total activity:  0.940 millicuries.  Reference Date: 09/08/2020. MAMMOGRAPHIC GUIDED RADIOACTIVE SEED LOCALIZATION OF THE RIGHT BREAST #1 (CYLINDER clip): Using mammographic guidance, sterile technique, 1% lidocaine and an I-125 radioactive seed, the CYLINDER clip was localized using a MEDIAL approach. The follow-up mammogram images confirm the seed in the expected location  and were marked for Dr. Marlou Starks. Follow-up survey of the patient confirms presence of the radioactive seed. Order number of I-125 seed:  [id number]. Total activity:  0.923 millicuries.  Reference Date: 08/27/2020. MAMMOGRAPHIC  GUIDED RADIOACTIVE SEED LOCALIZATION OF THE RIGHT BREAST #2 (BARBELL clip): Using mammographic guidance, sterile technique, 1% lidocaine and an I-125 radioactive seed, the BARBELL clip was localized using a MEDIAL approach. The follow-up mammogram images confirm the seed in the expected location and were marked for Dr. Marlou Starks. Follow-up survey of the patient confirms presence of the radioactive seed. Order number of I-125 seed:  [id number]. Total activity:  3.354 millicuries.  Reference Date: 08/27/2020. The patient tolerated the procedures well and was released from the [facility]. She was given instructions regarding seed removal. IMPRESSION: Radioactive seed localization of the LEFT breast. Two radioactive seed localizations of the RIGHT breast. No apparent complications. Electronically Signed   By: Margarette Canada M.D.   On: 09/09/2020 09:33  MM RT RADIOACTIVE SEED LOC MAMMO GUIDE  Result Date: 09/09/2020 CLINICAL DATA:  45 year old female for radioactive seed localization of LEFT breast cancer prior to lumpectomy and radioactive seed localizations of 2 areas of biopsy-proven PASH/MR enhancement within the RIGHT breast prior to RIGHT breast excisions. EXAM: MAMMOGRAPHIC GUIDED RADIOACTIVE SEED LOCALIZATION OF THE LEFT BREAST MAMMOGRAPHIC GUIDED RADIOACTIVE SEED LOCALIZATION OF THE RIGHT BREAST X 2 COMPARISON:  Previous exam(s). FINDINGS: Patient presents for radioactive seed localization prior to LEFT lumpectomy and 2 RIGHT breast surgical excisions. I met with the patient and we discussed the procedures of seed localization including benefits and alternatives. We discussed the high likelihood of successful procedures. We discussed the risks of the procedures including infection, bleeding, tissue injury and further surgery. We discussed the low dose of radioactivity involved in the procedures. Informed, written consent was given. The usual time-out protocol was performed immediately prior to the procedures.  MAMMOGRAPHIC GUIDED RADIOACTIVE SEED LOCALIZATION OF THE LEFT BREAST (carcinoma-RIBBON clip): Using mammographic guidance, sterile technique, 1% lidocaine and an I-125 radioactive seed, the RIBBON clip was localized using a LATERAL approach. The follow-up mammogram images confirm the seed in the expected location and were marked for Dr. Marlou Starks. Follow-up survey of the patient confirms presence of the radioactive seed. Order number of I-125 seed:  [id number]. Total activity:  7.342 millicuries.  Reference Date: 09/08/2020. MAMMOGRAPHIC GUIDED RADIOACTIVE SEED LOCALIZATION OF THE RIGHT BREAST #1 (CYLINDER clip): Using mammographic guidance, sterile technique, 1% lidocaine and an I-125 radioactive seed, the CYLINDER clip was localized using a MEDIAL approach. The follow-up mammogram images confirm the seed in the expected location and were marked for Dr. Marlou Starks. Follow-up survey of the patient confirms presence of the radioactive seed. Order number of I-125 seed:  [id number]. Total activity:  6.203 millicuries.  Reference Date: 08/27/2020. MAMMOGRAPHIC GUIDED RADIOACTIVE SEED LOCALIZATION OF THE RIGHT BREAST #2 (BARBELL clip): Using mammographic guidance, sterile technique, 1% lidocaine and an I-125 radioactive seed, the BARBELL clip was localized using a MEDIAL approach. The follow-up mammogram images confirm the seed in the expected location and were marked for Dr. Marlou Starks. Follow-up survey of the patient confirms presence of the radioactive seed. Order number of I-125 seed:  [id number]. Total activity:  4.536 millicuries.  Reference Date: 08/27/2020. The patient tolerated the procedures well and was released from the [facility]. She was given instructions regarding seed removal. IMPRESSION: Radioactive seed localization of the LEFT breast. Two radioactive seed localizations of the RIGHT breast. No apparent complications. Electronically Signed   By: Cleatis Polka.D.  On: 09/09/2020 09:33  MM RT RADIO SEED EA ADD LESION  LOC MAMMO  Result Date: 09/09/2020 CLINICAL DATA:  45 year old female for radioactive seed localization of LEFT breast cancer prior to lumpectomy and radioactive seed localizations of 2 areas of biopsy-proven PASH/MR enhancement within the RIGHT breast prior to RIGHT breast excisions. EXAM: MAMMOGRAPHIC GUIDED RADIOACTIVE SEED LOCALIZATION OF THE LEFT BREAST MAMMOGRAPHIC GUIDED RADIOACTIVE SEED LOCALIZATION OF THE RIGHT BREAST X 2 COMPARISON:  Previous exam(s). FINDINGS: Patient presents for radioactive seed localization prior to LEFT lumpectomy and 2 RIGHT breast surgical excisions. I met with the patient and we discussed the procedures of seed localization including benefits and alternatives. We discussed the high likelihood of successful procedures. We discussed the risks of the procedures including infection, bleeding, tissue injury and further surgery. We discussed the low dose of radioactivity involved in the procedures. Informed, written consent was given. The usual time-out protocol was performed immediately prior to the procedures. MAMMOGRAPHIC GUIDED RADIOACTIVE SEED LOCALIZATION OF THE LEFT BREAST (carcinoma-RIBBON clip): Using mammographic guidance, sterile technique, 1% lidocaine and an I-125 radioactive seed, the RIBBON clip was localized using a LATERAL approach. The follow-up mammogram images confirm the seed in the expected location and were marked for Dr. Marlou Starks. Follow-up survey of the patient confirms presence of the radioactive seed. Order number of I-125 seed:  [id number]. Total activity:  3.354 millicuries.  Reference Date: 09/08/2020. MAMMOGRAPHIC GUIDED RADIOACTIVE SEED LOCALIZATION OF THE RIGHT BREAST #1 (CYLINDER clip): Using mammographic guidance, sterile technique, 1% lidocaine and an I-125 radioactive seed, the CYLINDER clip was localized using a MEDIAL approach. The follow-up mammogram images confirm the seed in the expected location and were marked for Dr. Marlou Starks. Follow-up survey of the  patient confirms presence of the radioactive seed. Order number of I-125 seed:  [id number]. Total activity:  7.342 millicuries.  Reference Date: 08/27/2020. MAMMOGRAPHIC GUIDED RADIOACTIVE SEED LOCALIZATION OF THE RIGHT BREAST #2 (BARBELL clip): Using mammographic guidance, sterile technique, 1% lidocaine and an I-125 radioactive seed, the BARBELL clip was localized using a MEDIAL approach. The follow-up mammogram images confirm the seed in the expected location and were marked for Dr. Marlou Starks. Follow-up survey of the patient confirms presence of the radioactive seed. Order number of I-125 seed:  [id number]. Total activity:  6.203 millicuries.  Reference Date: 08/27/2020. The patient tolerated the procedures well and was released from the [facility]. She was given instructions regarding seed removal. IMPRESSION: Radioactive seed localization of the LEFT breast. Two radioactive seed localizations of the RIGHT breast. No apparent complications. Electronically Signed   By: Margarette Canada M.D.   On: 09/09/2020 09:33   Impression: Stage IA (pT2, pN0, cM0) Left Breast UOQ, Invasive Ductal Carcinoma with DCIS, ER+ / PR+ / Her2-, Grade 2  Patient would be a good candidate for radiation therapy as breast conserving surgery.  In addition she would appear to be a good candidate for hypofractionated accelerated radiation therapy over approximately 4 weeks.  I discussed the general course of radiation therapy anticipated side effects and potential long term toxicities.  The patient appears to understand and wishes to proceed with planned course of treatment.  Plan:  Patient is scheduled for CT simulation later today.  Treatments to begin next week.  Anticipate 21 treatments for the first 15 covering the whole breast.  She will have a boost to the lumpectomy cavity given the close surgical margins.  We will use cardiac sparing techniques if necessary.  -----------------------------------  Blair Promise, PhD, MD  This  document serves as a record of services personally performed by Gery Pray, MD. It was created on his behalf by Roney Mans, a trained medical scribe. The creation of this record is based on the scribe's personal observations and the provider's statements to them. This document has been checked and approved by the attending provider.

## 2020-10-06 ENCOUNTER — Other Ambulatory Visit: Payer: Self-pay

## 2020-10-06 ENCOUNTER — Ambulatory Visit
Admission: RE | Admit: 2020-10-06 | Discharge: 2020-10-06 | Disposition: A | Discharge status: Home / Self Care | Payer: BC Managed Care – PPO | Source: Ambulatory Visit | Attending: Radiation Oncology | Admitting: Radiation Oncology

## 2020-10-06 ENCOUNTER — Encounter: Payer: Self-pay | Admitting: Radiation Oncology

## 2020-10-06 VITALS — BP 111/70 | HR 61 | Temp 98.0°F | Resp 18 | Ht 66.0 in | Wt 125.2 lb

## 2020-10-06 DIAGNOSIS — Z170 Estrogen receptor positive status [ER+]: Secondary | ICD-10-CM

## 2020-10-06 DIAGNOSIS — C50412 Malignant neoplasm of upper-outer quadrant of left female breast: Secondary | ICD-10-CM | POA: Insufficient documentation

## 2020-10-06 DIAGNOSIS — Z9011 Acquired absence of right breast and nipple: Secondary | ICD-10-CM | POA: Diagnosis not present

## 2020-10-06 DIAGNOSIS — Z79899 Other long term (current) drug therapy: Secondary | ICD-10-CM | POA: Diagnosis not present

## 2020-10-06 DIAGNOSIS — Z510 Encounter for antineoplastic radiation therapy: Secondary | ICD-10-CM | POA: Diagnosis not present

## 2020-10-06 DIAGNOSIS — Z793 Long term (current) use of hormonal contraceptives: Secondary | ICD-10-CM | POA: Insufficient documentation

## 2020-10-06 DIAGNOSIS — C50912 Malignant neoplasm of unspecified site of left female breast: Secondary | ICD-10-CM | POA: Diagnosis not present

## 2020-10-06 NOTE — Progress Notes (Signed)
See MD note for nursing evaluation. °

## 2020-10-07 DIAGNOSIS — C50412 Malignant neoplasm of upper-outer quadrant of left female breast: Secondary | ICD-10-CM | POA: Diagnosis not present

## 2020-10-07 DIAGNOSIS — R5383 Other fatigue: Secondary | ICD-10-CM | POA: Diagnosis not present

## 2020-10-08 DIAGNOSIS — Z510 Encounter for antineoplastic radiation therapy: Secondary | ICD-10-CM | POA: Diagnosis not present

## 2020-10-08 DIAGNOSIS — C50412 Malignant neoplasm of upper-outer quadrant of left female breast: Secondary | ICD-10-CM | POA: Diagnosis not present

## 2020-10-08 DIAGNOSIS — Z170 Estrogen receptor positive status [ER+]: Secondary | ICD-10-CM | POA: Diagnosis not present

## 2020-10-14 ENCOUNTER — Other Ambulatory Visit: Payer: Self-pay

## 2020-10-14 ENCOUNTER — Encounter: Payer: Self-pay | Admitting: *Deleted

## 2020-10-14 ENCOUNTER — Ambulatory Visit
Admission: RE | Admit: 2020-10-14 | Discharge: 2020-10-14 | Disposition: A | Discharge status: Home / Self Care | Payer: BC Managed Care – PPO | Source: Ambulatory Visit | Attending: Radiation Oncology | Admitting: Radiation Oncology

## 2020-10-14 DIAGNOSIS — Z170 Estrogen receptor positive status [ER+]: Secondary | ICD-10-CM | POA: Diagnosis not present

## 2020-10-14 DIAGNOSIS — C50412 Malignant neoplasm of upper-outer quadrant of left female breast: Secondary | ICD-10-CM | POA: Diagnosis not present

## 2020-10-14 DIAGNOSIS — Z510 Encounter for antineoplastic radiation therapy: Secondary | ICD-10-CM | POA: Diagnosis not present

## 2020-10-14 MED ORDER — ALRA NON-METALLIC DEODORANT (RAD-ONC)
1.0000 "application " | Freq: Once | TOPICAL | Status: AC
  Administered 2020-10-14: 1 via TOPICAL

## 2020-10-14 MED ORDER — RADIAPLEXRX EX GEL
1.0000 "application " | Freq: Once | CUTANEOUS | Status: AC
  Administered 2020-10-14: 1 via TOPICAL

## 2020-10-15 ENCOUNTER — Ambulatory Visit
Admission: RE | Admit: 2020-10-15 | Discharge: 2020-10-15 | Disposition: A | Discharge status: Home / Self Care | Payer: BC Managed Care – PPO | Source: Ambulatory Visit | Attending: Radiation Oncology | Admitting: Radiation Oncology

## 2020-10-15 DIAGNOSIS — C50412 Malignant neoplasm of upper-outer quadrant of left female breast: Secondary | ICD-10-CM | POA: Diagnosis not present

## 2020-10-15 DIAGNOSIS — Z170 Estrogen receptor positive status [ER+]: Secondary | ICD-10-CM | POA: Diagnosis not present

## 2020-10-15 DIAGNOSIS — Z510 Encounter for antineoplastic radiation therapy: Secondary | ICD-10-CM | POA: Diagnosis not present

## 2020-10-16 ENCOUNTER — Telehealth: Payer: Self-pay

## 2020-10-16 ENCOUNTER — Ambulatory Visit
Admission: RE | Admit: 2020-10-16 | Discharge: 2020-10-16 | Disposition: A | Discharge status: Home / Self Care | Payer: BC Managed Care – PPO | Source: Ambulatory Visit | Attending: Radiation Oncology | Admitting: Radiation Oncology

## 2020-10-16 ENCOUNTER — Other Ambulatory Visit: Payer: Self-pay

## 2020-10-16 DIAGNOSIS — Z170 Estrogen receptor positive status [ER+]: Secondary | ICD-10-CM | POA: Diagnosis not present

## 2020-10-16 DIAGNOSIS — Z510 Encounter for antineoplastic radiation therapy: Secondary | ICD-10-CM | POA: Diagnosis not present

## 2020-10-16 DIAGNOSIS — C50412 Malignant neoplasm of upper-outer quadrant of left female breast: Secondary | ICD-10-CM | POA: Diagnosis not present

## 2020-10-16 NOTE — Telephone Encounter (Signed)
Notified Patient of completion of Initial Attending 57 Statement for [facility]. Fax transmission confirmation received and copy emailed to patient as requested.

## 2020-10-17 ENCOUNTER — Ambulatory Visit
Admission: RE | Admit: 2020-10-17 | Discharge: 2020-10-17 | Disposition: A | Discharge status: Home / Self Care | Payer: BC Managed Care – PPO | Source: Ambulatory Visit | Attending: Radiation Oncology | Admitting: Radiation Oncology

## 2020-10-17 DIAGNOSIS — Z170 Estrogen receptor positive status [ER+]: Secondary | ICD-10-CM | POA: Diagnosis not present

## 2020-10-17 DIAGNOSIS — Z510 Encounter for antineoplastic radiation therapy: Secondary | ICD-10-CM | POA: Diagnosis not present

## 2020-10-17 DIAGNOSIS — C50412 Malignant neoplasm of upper-outer quadrant of left female breast: Secondary | ICD-10-CM | POA: Diagnosis not present

## 2020-10-20 ENCOUNTER — Ambulatory Visit
Admission: RE | Admit: 2020-10-20 | Discharge: 2020-10-20 | Disposition: A | Discharge status: Home / Self Care | Payer: BC Managed Care – PPO | Source: Ambulatory Visit | Attending: Radiation Oncology | Admitting: Radiation Oncology

## 2020-10-20 ENCOUNTER — Other Ambulatory Visit: Payer: Self-pay

## 2020-10-20 DIAGNOSIS — Z510 Encounter for antineoplastic radiation therapy: Secondary | ICD-10-CM | POA: Diagnosis not present

## 2020-10-20 DIAGNOSIS — C50412 Malignant neoplasm of upper-outer quadrant of left female breast: Secondary | ICD-10-CM | POA: Diagnosis not present

## 2020-10-20 DIAGNOSIS — Z170 Estrogen receptor positive status [ER+]: Secondary | ICD-10-CM | POA: Diagnosis not present

## 2020-10-21 ENCOUNTER — Ambulatory Visit
Admission: RE | Admit: 2020-10-21 | Discharge: 2020-10-21 | Disposition: A | Discharge status: Home / Self Care | Payer: BC Managed Care – PPO | Source: Ambulatory Visit | Attending: Radiation Oncology | Admitting: Radiation Oncology

## 2020-10-21 DIAGNOSIS — Z170 Estrogen receptor positive status [ER+]: Secondary | ICD-10-CM | POA: Diagnosis not present

## 2020-10-21 DIAGNOSIS — C50412 Malignant neoplasm of upper-outer quadrant of left female breast: Secondary | ICD-10-CM

## 2020-10-21 DIAGNOSIS — Z510 Encounter for antineoplastic radiation therapy: Secondary | ICD-10-CM | POA: Diagnosis not present

## 2020-10-21 MED ORDER — ALRA NON-METALLIC DEODORANT (RAD-ONC)
1.0000 "application " | Freq: Once | TOPICAL | Status: AC
  Administered 2020-10-21: 1 via TOPICAL

## 2020-10-21 MED ORDER — RADIAPLEXRX EX GEL: Freq: Once | CUTANEOUS | Status: AC

## 2020-10-22 ENCOUNTER — Other Ambulatory Visit: Payer: Self-pay

## 2020-10-22 ENCOUNTER — Ambulatory Visit
Admission: RE | Admit: 2020-10-22 | Discharge: 2020-10-22 | Disposition: A | Discharge status: Home / Self Care | Payer: BC Managed Care – PPO | Source: Ambulatory Visit | Attending: Radiation Oncology | Admitting: Radiation Oncology

## 2020-10-22 DIAGNOSIS — C50412 Malignant neoplasm of upper-outer quadrant of left female breast: Secondary | ICD-10-CM | POA: Diagnosis not present

## 2020-10-22 DIAGNOSIS — Z170 Estrogen receptor positive status [ER+]: Secondary | ICD-10-CM | POA: Diagnosis not present

## 2020-10-22 DIAGNOSIS — Z510 Encounter for antineoplastic radiation therapy: Secondary | ICD-10-CM | POA: Diagnosis not present

## 2020-10-23 ENCOUNTER — Ambulatory Visit
Admission: RE | Admit: 2020-10-23 | Discharge: 2020-10-23 | Disposition: A | Discharge status: Home / Self Care | Payer: BC Managed Care – PPO | Source: Ambulatory Visit | Attending: Radiation Oncology | Admitting: Radiation Oncology

## 2020-10-23 DIAGNOSIS — Z510 Encounter for antineoplastic radiation therapy: Secondary | ICD-10-CM | POA: Diagnosis not present

## 2020-10-23 DIAGNOSIS — C50412 Malignant neoplasm of upper-outer quadrant of left female breast: Secondary | ICD-10-CM | POA: Diagnosis not present

## 2020-10-23 DIAGNOSIS — Z170 Estrogen receptor positive status [ER+]: Secondary | ICD-10-CM | POA: Diagnosis not present

## 2020-10-24 ENCOUNTER — Ambulatory Visit
Admission: RE | Admit: 2020-10-24 | Discharge: 2020-10-24 | Disposition: A | Discharge status: Home / Self Care | Payer: BC Managed Care – PPO | Source: Ambulatory Visit | Attending: Radiation Oncology | Admitting: Radiation Oncology

## 2020-10-24 ENCOUNTER — Other Ambulatory Visit: Payer: Self-pay

## 2020-10-24 DIAGNOSIS — Z170 Estrogen receptor positive status [ER+]: Secondary | ICD-10-CM | POA: Diagnosis not present

## 2020-10-24 DIAGNOSIS — Z510 Encounter for antineoplastic radiation therapy: Secondary | ICD-10-CM | POA: Diagnosis not present

## 2020-10-24 DIAGNOSIS — C50412 Malignant neoplasm of upper-outer quadrant of left female breast: Secondary | ICD-10-CM | POA: Diagnosis not present

## 2020-10-27 ENCOUNTER — Other Ambulatory Visit: Payer: Self-pay

## 2020-10-27 ENCOUNTER — Ambulatory Visit
Admission: RE | Admit: 2020-10-27 | Discharge: 2020-10-27 | Disposition: A | Discharge status: Home / Self Care | Payer: BC Managed Care – PPO | Source: Ambulatory Visit | Attending: Radiation Oncology | Admitting: Radiation Oncology

## 2020-10-27 DIAGNOSIS — Z510 Encounter for antineoplastic radiation therapy: Secondary | ICD-10-CM | POA: Diagnosis not present

## 2020-10-27 DIAGNOSIS — Z170 Estrogen receptor positive status [ER+]: Secondary | ICD-10-CM | POA: Diagnosis not present

## 2020-10-27 DIAGNOSIS — C50412 Malignant neoplasm of upper-outer quadrant of left female breast: Secondary | ICD-10-CM | POA: Diagnosis not present

## 2020-10-28 ENCOUNTER — Ambulatory Visit
Admission: RE | Admit: 2020-10-28 | Discharge: 2020-10-28 | Disposition: A | Discharge status: Home / Self Care | Payer: BC Managed Care – PPO | Source: Ambulatory Visit | Attending: Radiation Oncology | Admitting: Radiation Oncology

## 2020-10-28 ENCOUNTER — Ambulatory Visit: Payer: BC Managed Care – PPO | Admitting: Radiation Oncology

## 2020-10-28 ENCOUNTER — Encounter: Payer: Self-pay | Admitting: Rehabilitation

## 2020-10-28 ENCOUNTER — Ambulatory Visit: Payer: BC Managed Care – PPO | Attending: General Surgery | Admitting: Rehabilitation

## 2020-10-28 DIAGNOSIS — L599 Disorder of the skin and subcutaneous tissue related to radiation, unspecified: Secondary | ICD-10-CM

## 2020-10-28 DIAGNOSIS — Z170 Estrogen receptor positive status [ER+]: Secondary | ICD-10-CM

## 2020-10-28 DIAGNOSIS — Z510 Encounter for antineoplastic radiation therapy: Secondary | ICD-10-CM | POA: Diagnosis not present

## 2020-10-28 DIAGNOSIS — Z483 Aftercare following surgery for neoplasm: Secondary | ICD-10-CM

## 2020-10-28 DIAGNOSIS — C50412 Malignant neoplasm of upper-outer quadrant of left female breast: Secondary | ICD-10-CM | POA: Insufficient documentation

## 2020-10-28 NOTE — Therapy (Signed)
[name], [place], [address] Phone: [phone]   Fax:  [phone]  Physical Therapy Treatment  Patient Details  Name: Rachael Garza MRN: [id number] Date of Birth: [birth year] Referring Provider (PT): Dr. Autumn Messing   Encounter Date: 10/28/2020   PT End of Session - 10/28/20 0939     Visit Number 2    Number of Visits 3    Date for PT Re-Evaluation 12/09/20    PT Start Time 0906    PT Stop Time 0936    PT Time Calculation (min) 30 min    Activity Tolerance Patient tolerated treatment well    Behavior During Therapy [facility] for tasks assessed/performed             Past Medical History:  Diagnosis Date   Cancer [facility])    Encounter for insertion of mirena IUD    Family history of breast cancer 08/07/2020   Family history of colon cancer 08/07/2020   Family history of melanoma 08/07/2020   History of acne    Migraines    Situational stress     Past Surgical History:  Procedure Laterality Date   ? compression fracture L4 in college due to fall     BREAST LUMPECTOMY WITH RADIOACTIVE SEED LOCALIZATION Bilateral 09/09/2020   Procedure: LEFT BREAST RADIOACTIVE SEED LOCALIZATION LUMPECTOMY WITH SENITNEL LYMPH NODE, AND RIGHT RADIOACTIVE SEED LOCALIZATION LUMPECTOMY x2;  Surgeon: Jovita Kussmaul, MD;  Location: [facility];  Service: General;  Laterality: Bilateral;   TONSILLECTOMY AND ADENOIDECTOMY      There were no vitals filed for this visit.   Subjective Assessment - 10/28/20 0907     Subjective I think they just want to make sure I am ok.  Starting to get a little redness    Pertinent History Rt lumpectomy x 2 due to [place], Lt lumpectomy wih 0/3 nodes positive.  Currently in radiation and will then have antiestrogens x 10 years    Currently in Pain? No/denies                [facility] PT Assessment - 10/28/20 0001       Assessment   Medical Diagnosis Left breast cancer.    Referring  Provider (PT) Dr. Autumn Messing    Onset Date/Surgical Date 07/23/20    Prior Therapy None related to breast CA      Precautions   Precaution Comments lymphedema left UE      Restrictions   Weight Bearing Restrictions No      Balance Screen   Has the patient fallen in the past 6 months No    Has the patient had a decrease in activity level because of a fear of falling?  No    Is the patient reluctant to leave their home because of a fear of falling?  No      Home Ec[place] residence      Prior [place] Full time employment   on short term disability   Armed forces technical officer at computer    Leisure She walks 4-5x/week for 30-45 min      Cognition   Overall Cognitive Status Within Functional Limits for tasks assessed      AROM   Left Shoulder Flexion 165 Degrees    Left Shoulder ABduction 173 Degrees    Left Shoulder External Rotation 82 Degrees      Strength   Overall Strength Within functional limits  for tasks performed      Palpation   Palpation comment left incision: some increased scar tissue denisty around breast incision, axillary incision with none.  Rt breast: increased denisty around bil incisions may be from natural density of the breast               LYMPHEDEMA/ONCOLOGY QUESTIONNAIRE - 10/28/20 0001       Type   Cancer Type Left breast cancer      Surgeries   Lumpectomy Date 09/09/20    Number Lymph Nodes Removed 3      Treatment   Active Chemotherapy Treatment No    Active Radiation Treatment Yes    Current Hormone Treatment No      What other symptoms do you have   Are you Having Heaviness or Tightness No    Are you having Pain No      Left Upper Extremity Lymphedema   10 cm Proximal to Olecranon Process 25 cm    Olecranon Process 22 cm    10 cm Proximal to Ulnar Styloid Process 19.5 cm    Just Proximal to Ulnar Styloid Process 14.4 cm    Across Hand at [employer] 18.3 cm    At [employer] of 2nd  Digit 5.7 cm                Quick Dash - 10/28/20 0001     Open a tight or new jar Mild difficulty    Do heavy household chores (wash walls, wash floors) Mild difficulty    Carry a shopping bag or briefcase No difficulty    Wash your back No difficulty    Use a knife to cut food No difficulty    Recreational activities in which you take some force or impact through your arm, shoulder, or hand (golf, hammering, tennis) Mild difficulty    During the past week, to what extent has your arm, shoulder or hand problem interfered with your normal social activities with family, friends, neighbors, or groups? Not at all    During the past week, to what extent has your arm, shoulder or hand problem limited your work or other regular daily activities Not at all    Arm, shoulder, or hand pain. Mild    Tingling (pins and needles) in your arm, shoulder, or hand Mild    Difficulty Sleeping Mild difficulty    DASH Score 13.64 %                    [facility] Adult PT Treatment/Exercise - 10/28/20 0001       Self-Care   Self-Care Other Self-Care Comments;Scar Mobilizations    Scar Mobilizations education on scar massage while tolerated and gave pt small foam chip pack for left incision with education to start with 82min-1 hour and then use as needed.  Also education on having to break with both as radiation progresses.    Other Self-Care Comments  reviewed lymphedema risk reduction and SOZO                     PT Education - 10/28/20 0938     Education Details SOZO, risk reduction, scar massage, when to return    Person(s) Educated Patient    Methods Explanation;Handout    Comprehension Verbalized understanding                 PT Long Term Goals - 10/28/20 8756       PT  LONG TERM GOAL #1   Title Pt will demonstrate no PT needs in regards to breast cancer healing and side effects    Time 6    Period Weeks    Status New      PT LONG TERM GOAL #2   Title NA       PT LONG TERM GOAL #3   Title NA      PT LONG TERM GOAL #4   Title NA      PT LONG TERM GOAL #5   Title Patient will demonstrate she has regainde full shoulder ROM post operatively compared to baseline assessment.    Status Achieved                   Plan - 10/28/20 0939     Clinical Impression Statement Pt presents post Rt lumpectomy x 2 and left lumpectomy and SLNB mid way through radiation with redness starting on the left breast and anterior chest.  Overall pt has ROM equal or better to before surgery, no signs of lymphedema, and well healed incisions although pt does have some increased scar density bilaterally which will hopefully be helped with self massage and use of compresion bra with pad as tolerated.  Pt will return 3 weeks after radiation to do SOZO and to see if any more visits are needed.    Stability/Clinical Decision Making Stable/Uncomplicated    PT Frequency --   one more   PT Duration 6 weeks    PT Treatment/Interventions Therapeutic exercise;Patient/family education;ADLs/Self Care Home Management;Manual lymph drainage;Manual techniques    PT Next Visit Plan will reassess 3 weeks post radiation; especially scar tissue - do SOZO and schedule this out*    Consulted and Agree with Plan of Care Patient             Patient will benefit from skilled therapeutic intervention in order to improve the following deficits and impairments:  Postural dysfunction, Decreased range of motion, Decreased knowledge of precautions, Impaired UE functional use, Pain  Visit Diagnosis: Malignant neoplasm of upper-outer quadrant of left breast in female, estrogen receptor positive ([place])  Aftercare following surgery for neoplasm  Disorder of the skin and subcutaneous tissue related to radiation, unspecified     Problem List Patient Active Problem List   Diagnosis Date Noted   Genetic testing 08/14/2020   Family history of breast cancer 08/07/2020   Family history of  colon cancer 08/07/2020   Family history of melanoma 08/07/2020   Malignant neoplasm of upper-outer quadrant of left breast in female, estrogen receptor positive ([place]) 08/01/2020   Migraine without aura and without status migrainosus, not intractable 03/02/2020   Cervicalgia 03/02/2020   Bilateral occipital neuralgia 03/02/2020    Stark Bray, PT 10/28/2020, 9:44 AM  [place], [place], [address] Phone: [phone]   Fax:  [phone]  Name: KHALIDAH HERBOLD MRN: [id number] Date of Birth: [birth year]

## 2020-10-29 ENCOUNTER — Other Ambulatory Visit: Payer: Self-pay

## 2020-10-29 ENCOUNTER — Ambulatory Visit
Admission: RE | Admit: 2020-10-29 | Discharge: 2020-10-29 | Disposition: A | Discharge status: Home / Self Care | Payer: BC Managed Care – PPO | Source: Ambulatory Visit | Attending: Radiation Oncology | Admitting: Radiation Oncology

## 2020-10-29 DIAGNOSIS — C50412 Malignant neoplasm of upper-outer quadrant of left female breast: Secondary | ICD-10-CM | POA: Diagnosis not present

## 2020-10-29 DIAGNOSIS — Z170 Estrogen receptor positive status [ER+]: Secondary | ICD-10-CM | POA: Diagnosis not present

## 2020-10-29 DIAGNOSIS — Z510 Encounter for antineoplastic radiation therapy: Secondary | ICD-10-CM | POA: Diagnosis not present

## 2020-10-30 ENCOUNTER — Ambulatory Visit
Admission: RE | Admit: 2020-10-30 | Discharge: 2020-10-30 | Disposition: A | Discharge status: Home / Self Care | Payer: BC Managed Care – PPO | Source: Ambulatory Visit | Attending: Radiation Oncology | Admitting: Radiation Oncology

## 2020-10-30 DIAGNOSIS — Z510 Encounter for antineoplastic radiation therapy: Secondary | ICD-10-CM | POA: Diagnosis not present

## 2020-10-30 DIAGNOSIS — Z170 Estrogen receptor positive status [ER+]: Secondary | ICD-10-CM | POA: Diagnosis not present

## 2020-10-30 DIAGNOSIS — C50412 Malignant neoplasm of upper-outer quadrant of left female breast: Secondary | ICD-10-CM | POA: Diagnosis not present

## 2020-10-31 ENCOUNTER — Ambulatory Visit
Admission: RE | Admit: 2020-10-31 | Discharge: 2020-10-31 | Disposition: A | Discharge status: Home / Self Care | Payer: BC Managed Care – PPO | Source: Ambulatory Visit | Attending: Radiation Oncology | Admitting: Radiation Oncology

## 2020-10-31 ENCOUNTER — Other Ambulatory Visit: Payer: Self-pay

## 2020-10-31 DIAGNOSIS — C50412 Malignant neoplasm of upper-outer quadrant of left female breast: Secondary | ICD-10-CM | POA: Diagnosis not present

## 2020-10-31 DIAGNOSIS — Z510 Encounter for antineoplastic radiation therapy: Secondary | ICD-10-CM | POA: Diagnosis not present

## 2020-10-31 DIAGNOSIS — Z170 Estrogen receptor positive status [ER+]: Secondary | ICD-10-CM | POA: Diagnosis not present

## 2020-11-03 ENCOUNTER — Other Ambulatory Visit: Payer: Self-pay

## 2020-11-03 ENCOUNTER — Ambulatory Visit
Admission: RE | Admit: 2020-11-03 | Discharge: 2020-11-03 | Disposition: A | Discharge status: Home / Self Care | Payer: BC Managed Care – PPO | Source: Ambulatory Visit | Attending: Radiation Oncology | Admitting: Radiation Oncology

## 2020-11-03 ENCOUNTER — Encounter: Payer: Self-pay | Admitting: Hematology and Oncology

## 2020-11-03 DIAGNOSIS — C50412 Malignant neoplasm of upper-outer quadrant of left female breast: Secondary | ICD-10-CM | POA: Diagnosis not present

## 2020-11-03 DIAGNOSIS — Z170 Estrogen receptor positive status [ER+]: Secondary | ICD-10-CM | POA: Diagnosis not present

## 2020-11-03 DIAGNOSIS — Z510 Encounter for antineoplastic radiation therapy: Secondary | ICD-10-CM | POA: Diagnosis not present

## 2020-11-04 ENCOUNTER — Ambulatory Visit
Admission: RE | Admit: 2020-11-04 | Discharge: 2020-11-04 | Disposition: A | Discharge status: Home / Self Care | Payer: BC Managed Care – PPO | Source: Ambulatory Visit | Attending: Radiation Oncology | Admitting: Radiation Oncology

## 2020-11-04 DIAGNOSIS — Z510 Encounter for antineoplastic radiation therapy: Secondary | ICD-10-CM | POA: Diagnosis not present

## 2020-11-04 DIAGNOSIS — Z170 Estrogen receptor positive status [ER+]: Secondary | ICD-10-CM | POA: Diagnosis not present

## 2020-11-04 DIAGNOSIS — C50412 Malignant neoplasm of upper-outer quadrant of left female breast: Secondary | ICD-10-CM

## 2020-11-04 MED ORDER — SONAFINE EX EMUL
1.0000 "application " | Freq: Once | CUTANEOUS | Status: AC
  Administered 2020-11-04: 1 via TOPICAL

## 2020-11-05 ENCOUNTER — Ambulatory Visit
Admission: RE | Admit: 2020-11-05 | Discharge: 2020-11-05 | Disposition: A | Discharge status: Home / Self Care | Payer: BC Managed Care – PPO | Source: Ambulatory Visit | Attending: Radiation Oncology | Admitting: Radiation Oncology

## 2020-11-05 DIAGNOSIS — C50412 Malignant neoplasm of upper-outer quadrant of left female breast: Secondary | ICD-10-CM | POA: Diagnosis not present

## 2020-11-05 DIAGNOSIS — Z510 Encounter for antineoplastic radiation therapy: Secondary | ICD-10-CM | POA: Diagnosis not present

## 2020-11-05 DIAGNOSIS — Z170 Estrogen receptor positive status [ER+]: Secondary | ICD-10-CM | POA: Diagnosis not present

## 2020-11-05 NOTE — Progress Notes (Signed)
Patient Care Team: Shon Baton, MD as PCP - General (Internal Medicine) Rockwell Germany, RN as Oncology Nurse Navigator Mauro Kaufmann, RN as Oncology Nurse Navigator Gery Pray, MD as Consulting Physician (Radiation Oncology) Nicholas Lose, MD as Consulting Physician (Hematology and Oncology) Jovita Kussmaul, MD as Consulting Physician (General Surgery)  DIAGNOSIS:    ICD-10-CM   1. Malignant neoplasm of upper-outer quadrant of left breast in female, estrogen receptor positive ([place])  C50.412    Z17.0       SUMMARY OF ONCOLOGIC HISTORY: Oncology History  Malignant neoplasm of upper-outer quadrant of left breast in female, estrogen receptor positive ([place])  07/29/2020 Initial Diagnosis    Palpable left breast mass (very dense breasts), Mammogram revealed 2.9 cm masss, MRI 3.2 cm mass: Biopsy Grade 2 IDC ER 95%, PR 95%, Her 2 Neg, Ki 67 15% MRI on Right breast: 3.7 cm NME LIQ and 1.2 cm NME UIQ: Needs biopsies   08/06/2020 Cancer Staging   Staging form: Breast, AJCC 8th Edition - Clinical stage from 08/06/2020: Stage IB (cT2, cN0, cM0, G2, ER+, PR+, HER2-) - Signed by Nicholas Lose, MD on 08/06/2020 Stage prefix: Initial diagnosis Histologic grading system: 3 grade system Laterality: Left Staged by: Pathologist and managing physician Stage used in treatment planning: Yes National guidelines used in treatment planning: Yes Type of national guideline used in treatment planning: NCCN   08/13/2020 Genetic Testing   No pathogenic variants detected in [place] Panel or CancerNext-Expanded +RNAinsight Panel.  Variant of uncertain significance detected in BLM at  p.A914T (c.2740G>A).  The report dates are August 13, 2020 and August 18, 2020, respectively.    The BRCAplus panel offered by [employer] and includes sequencing and deletion/duplication analysis for the following 8 genes: ATM, BRCA1, BRCA2, CDH1, CHEK2, PALB2, PTEN, and TP53.   The CancerNext-Expanded gene panel offered by  [facility] and includes sequencing, rearrangement, and RNA analysis for the following 77 genes: AIP, ALK, APC, ATM, AXIN2, BAP1, BARD1, BLM, BMPR1A, BRCA1, BRCA2, BRIP1, CDC73, CDH1, CDK4, CDKN1B, CDKN2A, CHEK2, CTNNA1, DICER1, FANCC, FH, FLCN, GALNT12, KIF1B, LZTR1, MAX, MEN1, MET, MLH1, MSH2, MSH3, MSH6, MUTYH, NBN, NF1, NF2, NTHL1, PALB2, PHOX2B, PMS2, POT1, PRKAR1A, PTCH1, PTEN, RAD51C, RAD51D, RB1, RECQL, RET, SDHA, SDHAF2, SDHB, SDHC, SDHD, SMAD4, SMARCA4, SMARCB1, SMARCE1, STK11, SUFU, TMEM127, TP53, TSC1, TSC2, VHL and XRCC2 (sequencing and deletion/duplication); EGFR, EGLN1, HOXB13, KIT, MITF, PDGFRA, POLD1, and POLE (sequencing only); EPCAM and GREM1 (deletion/duplication only).    09/09/2020 Surgery   Right lumpectomy: Alpha Right lumpectomy 2.: PASH Left lumpectomy: Grade 2 IDC 2.9 cm, intermediate grade DCIS, margins negative, 0/3 lymph nodes negative, ER 95%, PR 95%, HER2 equal vocal FISH negative, Ki-67 15%   09/22/2020 Cancer Staging   Staging form: Breast, AJCC 8th Edition - Pathologic: Stage IA (pT2, pN0, cM0, G2, ER+, PR+, HER2-, Oncotype DX score: 15) - Signed by Nicholas Lose, MD on 09/22/2020 Stage prefix: Initial diagnosis Multigene prognostic tests performed: Oncotype DX Recurrence score range: Greater than or equal to 11 Histologic grading system: 3 grade system   10/15/2020 - 11/11/2020 Radiation Therapy   Adjuvant radiation   11/27/2020 -  Anti-estrogen oral therapy   Tamoxifen 20 mg daily     CHIEF COMPLIANT: Follow-up of left breast cancer  INTERVAL HISTORY: Rachael Garza is a 45 y.o. with above-mentioned history of left breast cancer. She presents to the clinic today for follow-up.  She is here to talk about starting tamoxifen therapy.  She has tolerated radiation  fairly well.  She does have radiation dermatitis.  She is also noted some stiffness of her fingers.  ALLERGIES:  is allergic to caffeine.  MEDICATIONS:  Current Outpatient Medications  Medication  Sig Dispense Refill   [START ON 11/27/2020] tamoxifen (NOLVADEX) 20 MG tablet Take 1 tablet (20 mg total) by mouth daily. 90 tablet 3   escitalopram (LEXAPRO) 10 MG tablet Take 10 mg by mouth daily.     levonorgestrel (MIRENA, 52 MG,) 20 MCG/24HR IUD Mirena 20 mcg/24 hours (7 yrs) 52 mg intrauterine device  Take 1 device every day by intrauterine route as directed.     melatonin 3 MG TABS tablet Take 3 mg by mouth at bedtime as needed (sleep).     venlafaxine XR (EFFEXOR-XR) 37.5 MG 24 hr capsule Take 1 capsule (37.5 mg total) by mouth daily with breakfast. 30 capsule 6   No current facility-administered medications for this visit.    PHYSICAL EXAMINATION: ECOG PERFORMANCE STATUS: 1 - Symptomatic but completely ambulatory  Vitals:   11/06/20 1129  BP: 107/68  Pulse: 84  Resp: 19  Temp: 97.9 F (36.6 C)  SpO2: 100%   Filed Weights   11/06/20 1129  Weight: 125 lb 6.4 oz (56.9 kg)      LABORATORY DATA:  I have reviewed the data as listed CMP Latest Ref Rng & Units 08/06/2020  Glucose 70 - 99 mg/dL 163(H)  BUN 6 - 20 mg/dL 15  Creatinine 0.44 - 1.00 mg/dL 0.74  Sodium 135 - 145 mmol/L 138  Potassium 3.5 - 5.1 mmol/L 3.5  Chloride 98 - 111 mmol/L 103  CO2 22 - 32 mmol/L 26  Calcium 8.9 - 10.3 mg/dL 9.0  Total Protein 6.5 - 8.1 g/dL 7.1  Total Bilirubin 0.3 - 1.2 mg/dL 0.8  Alkaline Phos 38 - 126 U/L 28(L)  AST 15 - 41 U/L 15  ALT 0 - 44 U/L 10    Lab Results  Component Value Date   WBC 6.5 09/05/2020   HGB 13.9 09/05/2020   HCT 41.9 09/05/2020   MCV 91.3 09/05/2020   PLT 289 09/05/2020   NEUTROABS 3.9 08/06/2020    ASSESSMENT & PLAN:  Malignant neoplasm of upper-outer quadrant of left breast in female, estrogen receptor positive ([place]) 09/09/2020: Right lumpectomy: PASH, Right lumpectomy 2.: PASH Left lumpectomy: Grade 2 IDC 2.9 cm, intermediate grade DCIS, margins negative, 0/3 lymph nodes negative, ER 95%, PR 95%, HER2 equal vocal FISH negative, Ki-67  15% Oncotype DX: Score 15, distant recurrence at 9 years: 4%   Pathology counseling: I discussed the final pathology report of the patient provided  a copy of this report. I discussed the margins as well as lymph node surgeries. We also discussed the final staging along with previously performed ER/PR and HER-2/neu testing.   Treatment plan: 1.  Adjuvant radiation 10/15/2020-11/11/2020 2. followed by adjuvant antiestrogen therapy with tamoxifen 20 mg daily x10 years   Anxiety: She was on Lexapro and we transition her to Effexor    Mirena IUD: We discussed this in detail.  We went back and forth on whether to remove the Mirena and finally decided to keep it at this time.   Tamoxifen counseling: We discussed the risks and benefits of tamoxifen. These include but not limited to insomnia, hot flashes, mood changes, vaginal dryness, and weight gain. Although rare, serious side effects including endometrial cancer, risk of blood clots were also discussed. We strongly believe that the benefits far outweigh the risks. Patient understands these  risks and consented to starting treatment. Planned treatment duration is 10 years. She will start tamoxifen at 10 mg daily for a month and then increase to 20 mg.    For surveillance she will get mammograms alternating with breast MRIs. Return to clinic in 3 months for survivorship care plan visit    No orders of the defined types were placed in this encounter.  The patient has a good understanding of the overall plan. she agrees with it. she will call with any problems that may develop before the next visit here.  Total time spent: 30 mins including face to face time and time spent for planning, charting and coordination of care  Rulon Eisenmenger, MD, MPH 11/06/2020  I, Thana Ates, am acting as scribe for Dr. Nicholas Lose.  I have reviewed the above documentation for accuracy and completeness, and I agree with the above.

## 2020-11-06 ENCOUNTER — Ambulatory Visit
Admission: RE | Admit: 2020-11-06 | Discharge: 2020-11-06 | Disposition: A | Discharge status: Home / Self Care | Payer: BC Managed Care – PPO | Source: Ambulatory Visit | Attending: Radiation Oncology | Admitting: Radiation Oncology

## 2020-11-06 ENCOUNTER — Other Ambulatory Visit: Payer: Self-pay

## 2020-11-06 ENCOUNTER — Inpatient Hospital Stay: Payer: BC Managed Care – PPO | Attending: Hematology and Oncology | Admitting: Hematology and Oncology

## 2020-11-06 DIAGNOSIS — L598 Other specified disorders of the skin and subcutaneous tissue related to radiation: Secondary | ICD-10-CM | POA: Insufficient documentation

## 2020-11-06 DIAGNOSIS — Z923 Personal history of irradiation: Secondary | ICD-10-CM | POA: Insufficient documentation

## 2020-11-06 DIAGNOSIS — R419 Unspecified symptoms and signs involving cognitive functions and awareness: Secondary | ICD-10-CM | POA: Insufficient documentation

## 2020-11-06 DIAGNOSIS — Z510 Encounter for antineoplastic radiation therapy: Secondary | ICD-10-CM | POA: Diagnosis not present

## 2020-11-06 DIAGNOSIS — C50412 Malignant neoplasm of upper-outer quadrant of left female breast: Secondary | ICD-10-CM

## 2020-11-06 DIAGNOSIS — Z79810 Long term (current) use of selective estrogen receptor modulators (SERMs): Secondary | ICD-10-CM | POA: Insufficient documentation

## 2020-11-06 DIAGNOSIS — Z170 Estrogen receptor positive status [ER+]: Secondary | ICD-10-CM | POA: Diagnosis not present

## 2020-11-06 DIAGNOSIS — Z79899 Other long term (current) drug therapy: Secondary | ICD-10-CM | POA: Diagnosis not present

## 2020-11-06 MED ORDER — TAMOXIFEN CITRATE 20 MG PO TABS: 20.0000 mg | ORAL_TABLET | Freq: Every day | ORAL | 3 refills | Status: DC

## 2020-11-06 NOTE — Assessment & Plan Note (Signed)
09/09/2020: Right lumpectomy: PASH, Right lumpectomy 2.: PASH Left lumpectomy: Grade 2 IDC 2.9 cm, intermediate grade DCIS, margins negative, 0/3 lymph nodes negative, ER 95%, PR 95%, HER2 equal vocal FISH negative, Ki-67 15% Oncotype DX: Score 15, distant recurrence at 9 years: 4%  Pathology counseling: I discussed the final pathology report of the patient provided  a copy of this report. I discussed the margins as well as lymph node surgeries. We also discussed the final staging along with previously performed ER/PR and HER-2/neu testing.  Treatment plan: 1.  Adjuvant radiation 10/15/2020-11/11/2020 2. followed by adjuvant antiestrogen therapy with tamoxifen 20 mg daily x10 years  Anxiety: She was on Lexapro and we transition her to Effexor   Mirena IUD: We discussed this in detail.  We decided to request her gynecologist to remove the Mirena and switch her to copper IUD.  Tamoxifen counseling: We discussed the risks and benefits of tamoxifen. These include but not limited to insomnia, hot flashes, mood changes, vaginal dryness, and weight gain. Although rare, serious side effects including endometrial cancer, risk of blood clots were also discussed. We strongly believe that the benefits far outweigh the risks. Patient understands these risks and consented to starting treatment. Planned treatment duration is 10 years.  Return to clinic in 3 months for survivorship care plan visit

## 2020-11-07 ENCOUNTER — Ambulatory Visit
Admission: RE | Admit: 2020-11-07 | Discharge: 2020-11-07 | Disposition: A | Discharge status: Home / Self Care | Payer: BC Managed Care – PPO | Source: Ambulatory Visit | Attending: Radiation Oncology | Admitting: Radiation Oncology

## 2020-11-07 DIAGNOSIS — Z510 Encounter for antineoplastic radiation therapy: Secondary | ICD-10-CM | POA: Diagnosis not present

## 2020-11-07 DIAGNOSIS — C50412 Malignant neoplasm of upper-outer quadrant of left female breast: Secondary | ICD-10-CM | POA: Diagnosis not present

## 2020-11-07 DIAGNOSIS — Z170 Estrogen receptor positive status [ER+]: Secondary | ICD-10-CM | POA: Diagnosis not present

## 2020-11-10 ENCOUNTER — Encounter: Payer: Self-pay | Admitting: *Deleted

## 2020-11-10 ENCOUNTER — Other Ambulatory Visit: Payer: Self-pay

## 2020-11-10 ENCOUNTER — Ambulatory Visit: Admission: RE | Admit: 2020-11-10 | Payer: BC Managed Care – PPO | Source: Ambulatory Visit

## 2020-11-10 DIAGNOSIS — C50412 Malignant neoplasm of upper-outer quadrant of left female breast: Secondary | ICD-10-CM

## 2020-11-10 DIAGNOSIS — Z170 Estrogen receptor positive status [ER+]: Secondary | ICD-10-CM

## 2020-11-11 ENCOUNTER — Ambulatory Visit: Payer: BC Managed Care – PPO

## 2020-11-11 ENCOUNTER — Ambulatory Visit
Admission: RE | Admit: 2020-11-11 | Discharge: 2020-11-11 | Disposition: A | Discharge status: Home / Self Care | Payer: BC Managed Care – PPO | Source: Ambulatory Visit | Attending: Radiation Oncology | Admitting: Radiation Oncology

## 2020-11-11 DIAGNOSIS — Z510 Encounter for antineoplastic radiation therapy: Secondary | ICD-10-CM | POA: Insufficient documentation

## 2020-11-11 DIAGNOSIS — Z170 Estrogen receptor positive status [ER+]: Secondary | ICD-10-CM | POA: Insufficient documentation

## 2020-11-11 DIAGNOSIS — C50412 Malignant neoplasm of upper-outer quadrant of left female breast: Secondary | ICD-10-CM | POA: Diagnosis not present

## 2020-11-12 ENCOUNTER — Ambulatory Visit
Admission: RE | Admit: 2020-11-12 | Discharge: 2020-11-12 | Disposition: A | Discharge status: Home / Self Care | Payer: BC Managed Care – PPO | Source: Ambulatory Visit | Attending: Radiation Oncology | Admitting: Radiation Oncology

## 2020-11-12 ENCOUNTER — Other Ambulatory Visit: Payer: Self-pay

## 2020-11-12 ENCOUNTER — Encounter: Payer: Self-pay | Admitting: Radiology

## 2020-11-12 ENCOUNTER — Encounter: Payer: Self-pay | Admitting: Radiation Oncology

## 2020-11-12 DIAGNOSIS — C50412 Malignant neoplasm of upper-outer quadrant of left female breast: Secondary | ICD-10-CM | POA: Diagnosis not present

## 2020-11-12 DIAGNOSIS — Z510 Encounter for antineoplastic radiation therapy: Secondary | ICD-10-CM | POA: Diagnosis not present

## 2020-11-12 DIAGNOSIS — G43009 Migraine without aura, not intractable, without status migrainosus: Secondary | ICD-10-CM

## 2020-11-12 DIAGNOSIS — Z170 Estrogen receptor positive status [ER+]: Secondary | ICD-10-CM

## 2020-11-12 MED ORDER — SONAFINE EX EMUL: 1.0000 "application " | Freq: Once | CUTANEOUS | Status: AC

## 2020-11-12 MED ORDER — SONAFINE EX EMUL
1.0000 "application " | Freq: Once | CUTANEOUS | Status: AC
  Administered 2020-11-12: 1 via TOPICAL

## 2020-11-13 ENCOUNTER — Encounter: Payer: Self-pay | Admitting: *Deleted

## 2020-12-03 ENCOUNTER — Other Ambulatory Visit: Payer: Self-pay

## 2020-12-03 ENCOUNTER — Ambulatory Visit: Payer: BC Managed Care – PPO | Attending: General Surgery | Admitting: Rehabilitation

## 2020-12-03 DIAGNOSIS — C50412 Malignant neoplasm of upper-outer quadrant of left female breast: Secondary | ICD-10-CM | POA: Insufficient documentation

## 2020-12-03 DIAGNOSIS — Z483 Aftercare following surgery for neoplasm: Secondary | ICD-10-CM | POA: Diagnosis not present

## 2020-12-03 DIAGNOSIS — L599 Disorder of the skin and subcutaneous tissue related to radiation, unspecified: Secondary | ICD-10-CM | POA: Diagnosis not present

## 2020-12-03 DIAGNOSIS — Z170 Estrogen receptor positive status [ER+]: Secondary | ICD-10-CM | POA: Diagnosis not present

## 2020-12-03 DIAGNOSIS — R293 Abnormal posture: Secondary | ICD-10-CM | POA: Insufficient documentation

## 2020-12-03 NOTE — Patient Instructions (Signed)
Wear sleeve all day x 4 weeks - not at night Continue normal activities and exercise

## 2020-12-03 NOTE — Therapy (Signed)
[place] [facility] @ [place], [place], [address] Phone: [phone]   Fax:  [phone]  Physical Therapy Treatment  Patient Details  Name: Rachael Garza MRN: [id number] Date of Birth: 07-Feb-1976 Referring Provider (PT): Dr. Autumn Messing   Encounter Date: 12/03/2020   PT End of Session - 12/03/20 1153     Visit Number 3    Number of Visits 3    Date for PT Re-Evaluation 12/09/20    PT Start Time 1100    PT Stop Time 1142    PT Time Calculation (min) 42 min    Activity Tolerance Patient tolerated treatment well    Behavior During Therapy [facility] for tasks assessed/performed             Past Medical History:  Diagnosis Date   Cancer [facility])    Encounter for insertion of mirena IUD    Family history of breast cancer 08/07/2020   Family history of colon cancer 08/07/2020   Family history of melanoma 08/07/2020   History of acne    Migraines    Situational stress     Past Surgical History:  Procedure Laterality Date   ? compression fracture L4 in college due to fall     BREAST LUMPECTOMY WITH RADIOACTIVE SEED LOCALIZATION Bilateral 09/09/2020   Procedure: LEFT BREAST RADIOACTIVE SEED LOCALIZATION LUMPECTOMY WITH SENITNEL LYMPH NODE, AND RIGHT RADIOACTIVE SEED LOCALIZATION LUMPECTOMY x2;  Surgeon: Jovita Kussmaul, MD;  Location: [facility];  Service: General;  Laterality: Bilateral;   TONSILLECTOMY AND ADENOIDECTOMY      There were no vitals filed for this visit.   Subjective Assessment - 12/03/20 1111     Subjective I finished radiation.  Skin did okay.  I still have that one spot on the edge of the incision we should check.    Pertinent History Rt lumpectomy x 2 due to [place], Lt lumpectomy wih 0/3 nodes positive.  Currently in radiation and will then have antiestrogens x 10 years    Currently in Pain? No/denies                [facility] PT Assessment - 12/03/20 0001       Palpation   Palpation comment one dog  ear type spot top of left breast incision with seems today more like skin vs scar tissue/edema.  Pt thinking the same thing.  Pt does have compression bra and foam to use                L-DEX FLOWSHEETS - 12/03/20 1100       L-DEX LYMPHEDEMA SCREENING   Measurement Type Unilateral    L-DEX MEASUREMENT EXTREMITY Upper Extremity    POSITION  Standing    DOMINANT SIDE Right    At Risk Side Left    BASELINE SCORE (UNILATERAL) -4.1    L-DEX SCORE (UNILATERAL) 2.9    VALUE CHANGE (UNILAT) 7    Comment repeated SOZO after emptying bladder which decreased it to 6.5 so still issued sleeve size 1 [facility].  Gave pt script to get gauntlet and sleeve in the correct color at a special place                                PT Education - 12/03/20 1153     Education Details use of sleeve and lymphedema prevention    Person(s) Educated Patient  Methods Explanation    Comprehension Verbalized understanding                 PT Long Term Goals - 10/28/20 0942       PT LONG TERM GOAL #1   Title Pt will demonstrate no PT needs in regards to breast cancer healing and side effects    Time 6    Period Weeks    Status New      PT LONG TERM GOAL #2   Title NA      PT LONG TERM GOAL #3   Title NA      PT LONG TERM GOAL #4   Title NA      PT LONG TERM GOAL #5   Title Patient will demonstrate she has regainde full shoulder ROM post operatively compared to baseline assessment.    Status Achieved                   Plan - 12/03/20 1155     Clinical Impression Statement Pt returns for post radiation check.  SOZO score has increased by 6.5-7.0 so pt was issued a sleeve in size 1 but the color she wants is not available in the clinic so she will go to a special place for a sleeve and matching gauntlet.  Examined breast again which seems like the spot in question is more how the skin lays at the incision closure vs scar tissue/edema.  Pt will return  in 4 weeks for SOZO recheck and instruction.    PT Next Visit Plan do SOZO and schedule out    Consulted and Agree with Plan of Care Patient             Patient will benefit from skilled therapeutic intervention in order to improve the following deficits and impairments:  Postural dysfunction, Decreased range of motion, Decreased knowledge of precautions, Impaired UE functional use, Pain  Visit Diagnosis: Malignant neoplasm of upper-outer quadrant of left breast in female, estrogen receptor positive ([place])  Aftercare following surgery for neoplasm  Disorder of the skin and subcutaneous tissue related to radiation, unspecified  Abnormal posture     Problem List Patient Active Problem List   Diagnosis Date Noted   Genetic testing 08/14/2020   Family history of breast cancer 08/07/2020   Family history of colon cancer 08/07/2020   Family history of melanoma 08/07/2020   Malignant neoplasm of upper-outer quadrant of left breast in female, estrogen receptor positive ([place]) 08/01/2020   Migraine without aura and without status migrainosus, not intractable 03/02/2020   Cervicalgia 03/02/2020   Bilateral occipital neuralgia 03/02/2020    Stark Bray, PT 12/03/2020, 11:59 AM  [facility] @ [place], [place], [address] Phone: [phone]   Fax:  [phone]  Name: Rachael Garza MRN: [id number] Date of Birth: 1975/11/12

## 2020-12-10 ENCOUNTER — Encounter: Payer: Self-pay | Admitting: Radiation Oncology

## 2020-12-13 NOTE — Progress Notes (Incomplete)
  Radiation Oncology         [phone] ________________________________  Patient Name: Rachael Garza MRN: [id number] DOB: [birth year] Referring Physician: Shon Baton (Profile Not Attached) Date of Service: 11/12/2020 [facility], [place]  End Of Treatment Note  Diagnoses: C50.412-Malignant neoplasm of upper-outer quadrant of left female breast  Cancer Staging:  Stage IA (pT2, pN0, cM0) Left Breast UOQ, Invasive Ductal Carcinoma with DCIS, ER+ / PR+ / Her2-, Grade 2  Intent: Curative  Radiation Treatment Dates: 10/14/2020 through 11/12/2020 Site Technique Total Dose (Gy) Dose per Fx (Gy) Completed Fx Beam Energies  Breast, Left: Breast_Lt 3D 40.05/40.05 2.67 15/15 6X  Breast, Left: Breast_Lt_Bst 3D 12/12 2 6/6 6X, 10X   Narrative: The patient tolerated radiation therapy relatively well. She denies pain, though reports having some tenderness and swelling to left breast. Continues to have full range of motion to left arm. Patient reports skin feels like it is burning, also reports redness and itching. Patient reports mild fatigue though not on evaluation today.  On physical exam, dry desquamation is noted throughout much of the left breast and diffuse erythema without any moist desquamation.  No signs of infection within the breast.   Plan: She has significant irritation in the left breast.  Hopefully this will improve over the next couple of weeks.  She would like to resume working Oct.  31st.  Note has been written for her to take to work concerning this issue.  Routine follow-up in 1 month or sooner if skin reaction becomes more significant. ________________________________________________ -----------------------------------  Blair Promise, PhD, MD  This document serves as a record of services personally performed by Gery Pray, MD. It was created on his behalf by Roney Mans, a trained medical scribe. The creation of this record is based on the scribe's  personal observations and the provider's statements to them. This document has been checked and approved by the attending provider.

## 2020-12-14 NOTE — Progress Notes (Signed)
Radiation Oncology         [phone] ________________________________  Name: Rachael Garza MRN: [id number]  Date: 12/15/2020  DOB: [birth year]  Follow-Up Visit Note  CC: Shon Baton, MD  Shon Baton, MD    ICD-10-CM   1. Malignant neoplasm of upper-outer quadrant of left breast in female, estrogen receptor positive ([place])  C50.412    Z17.0       Diagnosis: Stage IA (pT2, pN0, cM0) Left Breast UOQ, Invasive Ductal Carcinoma with DCIS, ER+ / PR+ / Her2-, Grade 2  Interval Since Last Radiation: 1 month and 2 days   Intent: Curative  Radiation Treatment Dates: 10/14/2020 through 11/12/2020 Site Technique Total Dose (Gy) Dose per Fx (Gy) Completed Fx Beam Energies  Breast, Left: Breast_Lt 3D 40.05/40.05 2.67 15/15 6X  Breast, Left: Breast_Lt_Bst 3D 12/12 2 6/6 6X, 10X    Narrative:  The patient returns today for routine follow-up. The patient tolerated radiation therapy relatively well with the exception of some tenderness, swelling, burning, redness and itching to her left breast. Physical exam performed on the day of her final treatment revealed significant dry desquamation throughout much of the left breast and diffuse erythema without any moist desquamation.  No signs of infection within the breast were otherwise seen.    Since the patient was seen for consultation, she followed up with Dr. Lindi Adie on 11/06/20 to discuss starting tamoxifen therapy. Following discussion of the risks and benefits, the patient opted to proceed with starting treatment. Planned treatment duration is 10 years. She will start tamoxifen at 10 mg daily for a month and then increase to 20 mg.           The patient has noticed increased sensitivity to light since starting tamoxifen.  She is working 3 days/week in the office and 2 at home.  She is also noticed increase in her migraines since starting this medication.  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  She may speak with her supervisor to request additional remote work until her fatigue improves.      She has been wearing a lymphedema sleeve along the left arm as recommended by physical therapy.      Allergies:  is allergic to caffeine.  Meds: Current Outpatient Medications  Medication Sig Dispense Refill   levonorgestrel (MIRENA, 52 MG,) 20 MCG/24HR IUD Mirena 20 mcg/24 hours (7 yrs) 52 mg intrauterine device  Take 1 device every day by intrauterine route as directed.     melatonin 3 MG TABS tablet Take 3 mg by mouth at bedtime as needed (sleep).     tamoxifen (NOLVADEX) 20 MG tablet Take 1 tablet (20 mg total) by mouth daily. 90 tablet 3   venlafaxine XR (EFFEXOR-XR) 37.5 MG 24 hr capsule Take 1 capsule (37.5 mg total) by mouth daily with breakfast. 30 capsule 6   No current facility-administered medications for this encounter.   Facility-Administered Medications Ordered in Other Encounters  Medication Dose Route Frequency Provider Last Rate Last Admin   Sonafine emulsion 1 application  1 application Topical Once Gery Pray, MD        Physical Findings: The patient is in no acute distress. Patient is alert and oriented.  height is $RemoveB'5\' 6"'iXWJcIDR$  (1.676 m) and weight is 127 lb 3.2 oz (57.7 kg). Her temperature is 97.8 F (36.6 C). Her blood pressure is 116/65 and her pulse is  94. Her respiration is 18 and oxygen saturation is 100%. .  No significant changes. Lungs are clear to auscultation bilaterally. Heart has regular rate and rhythm. No palpable cervical, supraclavicular, or axillary adenopathy. Abdomen soft, non-tender, normal bowel sounds.  Right breast: no palpable mass, nipple discharge or bleeding.  Some induration noted along her lumpectomy scar in the right breast.  Examination of the left breast reveals mild edema in the breast.  Patient's skin is healed well.  Mild hyperpigmentation changes.  No dominant mass appreciated in the  breast nipple discharge or bleeding.  Some induration along her lumpectomy scar noted.  Lymphedema sleeve noted along the left arm.    Lab Findings: Lab Results  Component Value Date   WBC 6.5 09/05/2020   HGB 13.9 09/05/2020   HCT 41.9 09/05/2020   MCV 91.3 09/05/2020   PLT 289 09/05/2020    Radiographic Findings: No results found.  Impression:   Stage IA (pT2, pN0, cM0) Left Breast UOQ, Invasive Ductal Carcinoma with DCIS, ER+ / PR+ / Her2-, Grade 2  The patient is recovering from the effects of radiation.  She continues to have some fatigue but this is improving.  She has back to working full-time as above with issues mentioned above.  No evidence of recurrence on clinical exam today  Plan: As needed follow-up in radiation oncology.  Patient will continue close follow-up to medical oncology as well as surgery.    ____________________________________  Blair Promise, PhD, MD   This document serves as a record of services personally performed by Gery Pray, MD. It was created on his behalf by Roney Mans, a trained medical scribe. The creation of this record is based on the scribe's personal observations and the provider's statements to them. This document has been checked and approved by the attending provider.

## 2020-12-15 ENCOUNTER — Ambulatory Visit
Admission: RE | Admit: 2020-12-15 | Discharge: 2020-12-15 | Disposition: A | Discharge status: Home / Self Care | Payer: BC Managed Care – PPO | Source: Ambulatory Visit | Attending: Radiation Oncology | Admitting: Radiation Oncology

## 2020-12-15 ENCOUNTER — Encounter: Payer: Self-pay | Admitting: Radiation Oncology

## 2020-12-15 ENCOUNTER — Other Ambulatory Visit: Payer: Self-pay

## 2020-12-15 DIAGNOSIS — C50412 Malignant neoplasm of upper-outer quadrant of left female breast: Secondary | ICD-10-CM | POA: Diagnosis not present

## 2020-12-15 DIAGNOSIS — Z923 Personal history of irradiation: Secondary | ICD-10-CM | POA: Diagnosis not present

## 2020-12-15 DIAGNOSIS — Z79810 Long term (current) use of selective estrogen receptor modulators (SERMs): Secondary | ICD-10-CM | POA: Insufficient documentation

## 2020-12-15 DIAGNOSIS — G43909 Migraine, unspecified, not intractable, without status migrainosus: Secondary | ICD-10-CM | POA: Diagnosis not present

## 2020-12-15 DIAGNOSIS — Z170 Estrogen receptor positive status [ER+]: Secondary | ICD-10-CM | POA: Insufficient documentation

## 2020-12-15 DIAGNOSIS — R5383 Other fatigue: Secondary | ICD-10-CM | POA: Insufficient documentation

## 2020-12-15 HISTORY — DX: Personal history of irradiation: Z92.3

## 2020-12-15 NOTE — Progress Notes (Signed)
Rachael Garza is here today for follow up post radiation to the breast.   Breast Side:Left,  They completed their radiation on: 11/12/20   Does the patient complain of any of the following: Post radiation skin issues: Patient reports skin has healed. No issues at this time.  Breast Tenderness:  yes Breast Swelling: reports having mild swelling.  Lymphadema: Patient is wearing a lymphedema sleeve x 4 weeks.  Range of Motion limitations: Patient has full range of motion, currently in physical therapy. Fatigue post radiation: Patient continues to have fatigue.  Appetite good/fair/poor: good  Additional comments if applicable:  Patient reports recently starting on Tamoxifen. Reports having increase in migraines when she started new medication.   Vitals:   12/15/20 1037  BP: 116/65  Pulse: 94  Resp: 18  Temp: 97.8 F (36.6 C)  SpO2: 100%  Weight: 127 lb 3.2 oz (57.7 kg)  Height: 5\' 6"  (1.676 m)

## 2021-01-07 ENCOUNTER — Encounter: Payer: Self-pay | Admitting: Gastroenterology

## 2021-01-07 ENCOUNTER — Other Ambulatory Visit: Payer: Self-pay

## 2021-01-07 ENCOUNTER — Encounter: Payer: Self-pay | Admitting: Hematology and Oncology

## 2021-01-07 ENCOUNTER — Other Ambulatory Visit: Payer: Self-pay | Admitting: Internal Medicine

## 2021-01-07 ENCOUNTER — Encounter: Payer: Self-pay | Admitting: Rehabilitation

## 2021-01-07 ENCOUNTER — Ambulatory Visit: Payer: BC Managed Care – PPO | Attending: General Surgery | Admitting: Rehabilitation

## 2021-01-07 DIAGNOSIS — Z0000 Encounter for general adult medical examination without abnormal findings: Secondary | ICD-10-CM

## 2021-01-07 DIAGNOSIS — Z170 Estrogen receptor positive status [ER+]: Secondary | ICD-10-CM | POA: Insufficient documentation

## 2021-01-07 DIAGNOSIS — C50412 Malignant neoplasm of upper-outer quadrant of left female breast: Secondary | ICD-10-CM | POA: Insufficient documentation

## 2021-01-07 DIAGNOSIS — Z8249 Family history of ischemic heart disease and other diseases of the circulatory system: Secondary | ICD-10-CM

## 2021-01-07 DIAGNOSIS — Z483 Aftercare following surgery for neoplasm: Secondary | ICD-10-CM | POA: Insufficient documentation

## 2021-01-07 NOTE — Therapy (Addendum)
[facility] @ [address] [place], [place], [address] Phone: [phone]   Fax:  [phone]  Physical Therapy Treatment  Patient Details  Name: Rachael Garza MRN: [id number] Date of Birth: 09/21/1975 Referring Provider (PT): Dr. Autumn Messing   Encounter Date: 01/07/2021   PT End of Session - 01/07/21 1337     Visit Number 3    Number of Visits 3    Date for PT Re-Evaluation 12/09/20    PT Start Time 1300    PT Stop Time 1315    PT Time Calculation (min) 15 min    Activity Tolerance Patient tolerated treatment well    Behavior During Therapy [facility] for tasks assessed/performed             Past Medical History:  Diagnosis Date   Cancer [facility])    Encounter for insertion of mirena IUD    Family history of breast cancer 08/07/2020   Family history of colon cancer 08/07/2020   Family history of melanoma 08/07/2020   History of acne    History of radiation therapy    Left breast 10/14/20-11/12/20 Dr. Gery Pray   Migraines    Situational stress     Past Surgical History:  Procedure Laterality Date   ? compression fracture L4 in college due to fall     BREAST LUMPECTOMY WITH RADIOACTIVE SEED LOCALIZATION Bilateral 09/09/2020   Procedure: LEFT BREAST RADIOACTIVE SEED LOCALIZATION LUMPECTOMY WITH SENITNEL LYMPH NODE, AND RIGHT RADIOACTIVE SEED LOCALIZATION LUMPECTOMY x2;  Surgeon: Jovita Kussmaul, MD;  Location: [facility];  Service: General;  Laterality: Bilateral;   TONSILLECTOMY AND ADENOIDECTOMY      There were no vitals filed for this visit.           L-DEX FLOWSHEETS - 01/07/21 1300       L-DEX LYMPHEDEMA SCREENING   Measurement Type Unilateral    L-DEX MEASUREMENT EXTREMITY Upper Extremity    POSITION  Standing    DOMINANT SIDE Right    At Risk Side Left    BASELINE SCORE (UNILATERAL) -4.1    L-DEX SCORE (UNILATERAL) 3.9    VALUE CHANGE (UNILAT) 8                                      PT Long Term Goals - 10/28/20 8527       PT LONG TERM GOAL #1   Title Pt will demonstrate no PT needs in regards to breast cancer healing and side effects    Time 6    Period Weeks    Status New      PT LONG TERM GOAL #2   Title NA      PT LONG TERM GOAL #3   Title NA      PT LONG TERM GOAL #4   Title NA      PT LONG TERM GOAL #5   Title Patient will demonstrate she has regainde full shoulder ROM post operatively compared to baseline assessment.    Status Achieved                   Plan - 01/07/21 1338     Clinical Impression Statement No change in SOZO score with  wearing compression sleeve x 1 month.  Pt will continue with sleeve wear and will call to set up a treatment time to learn MLD and repeat  the score in around 1 month           teach pt MLD - may need combo UE/breast as she mentions this is swollen - schedule more in person if needed or cont MLD until SOZO recheck  Patient will benefit from skilled therapeutic intervention in order to improve the following deficits and impairments:  Postural dysfunction, Decreased range of motion, Decreased knowledge of precautions, Impaired UE functional use, Pain  Visit Diagnosis: Malignant neoplasm of upper-outer quadrant of left breast in female, estrogen receptor positive [facility])  Aftercare following surgery for neoplasm     Problem List Patient Active Problem List   Diagnosis Date Noted   Genetic testing 08/14/2020   Family history of breast cancer 08/07/2020   Family history of colon cancer 08/07/2020   Family history of melanoma 08/07/2020   Malignant neoplasm of upper-outer quadrant of left breast in female, estrogen receptor positive ([place]) 08/01/2020   Migraine without aura and without status migrainosus, not intractable 03/02/2020   Cervicalgia 03/02/2020   Bilateral occipital neuralgia 03/02/2020    Stark Bray, PT 01/07/2021, 1:39 PM  [facility] @ [address] [place], [place], [address] Phone: [phone]   Fax:  [phone]  Name: Rachael Garza MRN: [id number] Date of Birth: April 24, 1975

## 2021-01-08 ENCOUNTER — Telehealth: Payer: Self-pay | Admitting: Adult Health

## 2021-01-08 NOTE — Telephone Encounter (Signed)
Scheduled per sch msg. Called and spoke with patient. Confirmed appt  

## 2021-01-09 ENCOUNTER — Telehealth: Payer: Self-pay | Admitting: Adult Health

## 2021-01-09 NOTE — Telephone Encounter (Signed)
Scheduled per sch msg. Called and left msg  

## 2021-01-12 ENCOUNTER — Encounter: Payer: Self-pay | Admitting: Rehabilitation

## 2021-01-26 ENCOUNTER — Other Ambulatory Visit: Payer: Self-pay

## 2021-01-26 ENCOUNTER — Encounter: Payer: Self-pay | Admitting: Adult Health

## 2021-01-26 ENCOUNTER — Inpatient Hospital Stay: Payer: BC Managed Care – PPO | Attending: Hematology and Oncology | Admitting: Adult Health

## 2021-01-26 VITALS — BP 115/66 | HR 87 | Temp 98.1°F | Resp 18 | Wt 127.6 lb

## 2021-01-26 DIAGNOSIS — Z79810 Long term (current) use of selective estrogen receptor modulators (SERMs): Secondary | ICD-10-CM | POA: Diagnosis not present

## 2021-01-26 DIAGNOSIS — Z793 Long term (current) use of hormonal contraceptives: Secondary | ICD-10-CM | POA: Diagnosis not present

## 2021-01-26 DIAGNOSIS — Z170 Estrogen receptor positive status [ER+]: Secondary | ICD-10-CM | POA: Insufficient documentation

## 2021-01-26 DIAGNOSIS — C50412 Malignant neoplasm of upper-outer quadrant of left female breast: Secondary | ICD-10-CM | POA: Diagnosis not present

## 2021-01-26 DIAGNOSIS — Z79899 Other long term (current) drug therapy: Secondary | ICD-10-CM | POA: Diagnosis not present

## 2021-01-26 DIAGNOSIS — Z923 Personal history of irradiation: Secondary | ICD-10-CM | POA: Diagnosis not present

## 2021-01-26 NOTE — Progress Notes (Signed)
SURVIVORSHIP VISIT:   BRIEF ONCOLOGIC HISTORY:  Oncology History  Malignant neoplasm of upper-outer quadrant of left breast in female, estrogen receptor positive (HCC)  07/29/2020 Initial Diagnosis    Palpable left breast mass (very dense breasts), Mammogram revealed 2.9 cm masss, MRI 3.2 cm mass: Biopsy Grade 2 IDC ER 95%, PR 95%, Her 2 Neg, Ki 67 15% MRI on Right breast: 3.7 cm NME LIQ and 1.2 cm NME UIQ: Needs biopsies   08/06/2020 Cancer Staging   Staging form: Breast, AJCC 8th Edition - Clinical stage from 08/06/2020: Stage IB (cT2, cN0, cM0, G2, ER+, PR+, HER2-) - Signed by Serena Croissant, MD on 08/06/2020 Stage prefix: Initial diagnosis Histologic grading system: 3 grade system Laterality: Left Staged by: Pathologist and managing physician Stage used in treatment planning: Yes National guidelines used in treatment planning: Yes Type of national guideline used in treatment planning: NCCN    08/13/2020 Genetic Testing   No pathogenic variants detected in [facility] BRCAPlus Panel or CancerNext-Expanded +RNAinsight Panel.  Variant of uncertain significance detected in BLM at  p.A914T (c.2740G>A).  The report dates are August 13, 2020 and August 18, 2020, respectively.    The BRCAplus panel offered by [employer] and includes sequencing and deletion/duplication analysis for the following 8 genes: ATM, BRCA1, BRCA2, CDH1, CHEK2, PALB2, PTEN, and TP53.   The CancerNext-Expanded gene panel offered by [facility] and includes sequencing, rearrangement, and RNA analysis for the following 77 genes: AIP, ALK, APC, ATM, AXIN2, BAP1, BARD1, BLM, BMPR1A, BRCA1, BRCA2, BRIP1, CDC73, CDH1, CDK4, CDKN1B, CDKN2A, CHEK2, CTNNA1, DICER1, FANCC, FH, FLCN, GALNT12, KIF1B, LZTR1, MAX, MEN1, MET, MLH1, MSH2, MSH3, MSH6, MUTYH, NBN, NF1, NF2, NTHL1, PALB2, PHOX2B, PMS2, POT1, PRKAR1A, PTCH1, PTEN, RAD51C, RAD51D, RB1, RECQL, RET, SDHA, SDHAF2, SDHB, SDHC, SDHD, SMAD4, SMARCA4, SMARCB1, SMARCE1, STK11, SUFU, TMEM127,  TP53, TSC1, TSC2, VHL and XRCC2 (sequencing and deletion/duplication); EGFR, EGLN1, HOXB13, KIT, MITF, PDGFRA, POLD1, and POLE (sequencing only); EPCAM and GREM1 (deletion/duplication only).    09/09/2020 Surgery   Right lumpectomy: PASH Right lumpectomy 2.: PASH Left lumpectomy: Grade 2 IDC 2.9 cm, intermediate grade DCIS, margins negative, 0/3 lymph nodes negative, ER 95%, PR 95%, HER2 equal vocal FISH negative, Ki-67 15%   09/22/2020 Cancer Staging   Staging form: Breast, AJCC 8th Edition - Pathologic: Stage IA (pT2, pN0, cM0, G2, ER+, PR+, HER2-, Oncotype DX score: 15) - Signed by Serena Croissant, MD on 09/22/2020 Stage prefix: Initial diagnosis Multigene prognostic tests performed: Oncotype DX Recurrence score range: Greater than or equal to 11 Histologic grading system: 3 grade system    10/15/2020 - 11/11/2020 Radiation Therapy   Adjuvant radiation   11/27/2020 -  Anti-estrogen oral therapy   Tamoxifen 20 mg daily     INTERVAL HISTORY:  [name] to review her survivorship care plan detailing her treatment course for breast cancer, as well as monitoring long-term side effects of that treatment, education regarding health maintenance, screening, and overall wellness and health promotion.     Overall, Rachael Garza reports feeling quite well she continues on tamoxifen daily.  She notes that she started on tamoxifen at 10 mg daily and has recently increase this to 20 mg daily.  Her only issue is mild intermittent hot flashes.  She is taking Effexor Exar daily.  She notes that this works better than the Lexapro she was previously on.  REVIEW OF SYSTEMS:  Review of Systems  Constitutional:  Negative for appetite change, chills, fatigue, fever and unexpected weight change.  HENT:  Negative for hearing loss, lump/mass and trouble swallowing.   Eyes:  Negative for eye problems and icterus.  Respiratory:  Negative for chest tightness, cough and shortness of breath.   Cardiovascular:  Negative  for chest pain, leg swelling and palpitations.  Gastrointestinal:  Negative for abdominal distention, abdominal pain, constipation, diarrhea, nausea and vomiting.  Endocrine: Positive for hot flashes.  Genitourinary:  Negative for difficulty urinating.   Musculoskeletal:  Negative for arthralgias.  Skin:  Negative for itching and rash.  Neurological:  Negative for dizziness, extremity weakness, headaches and numbness.  Hematological:  Negative for adenopathy. Does not bruise/bleed easily.  Psychiatric/Behavioral:  Negative for depression. The patient is not nervous/anxious.   Breast: Denies any new nodularity, masses, tenderness, nipple changes, or nipple discharge.    ONCOLOGY TREATMENT TEAM:  1. Surgeon:  Dr. Marlou Starks at [facility] 2. Medical Oncologist: Dr. Lindi Garza  3. Radiation Oncologist: Dr. Sondra Come    PAST MEDICAL/SURGICAL HISTORY:  Past Medical History:  Diagnosis Date   Cancer [facility])    Encounter for insertion of mirena IUD    Family history of breast cancer 08/07/2020   Family history of colon cancer 08/07/2020   Family history of melanoma 08/07/2020   History of acne    History of radiation therapy    Left breast 10/14/20-11/12/20 Dr. Gery Pray   Migraines    Situational stress    Past Surgical History:  Procedure Laterality Date   ? compression fracture L4 in college due to fall     BREAST LUMPECTOMY WITH RADIOACTIVE SEED LOCALIZATION Bilateral 09/09/2020   Procedure: LEFT BREAST RADIOACTIVE SEED LOCALIZATION LUMPECTOMY WITH SENITNEL LYMPH NODE, AND RIGHT RADIOACTIVE SEED LOCALIZATION LUMPECTOMY x2;  Surgeon: Jovita Kussmaul, MD;  Location: [place];  Service: General;  Laterality: Bilateral;   TONSILLECTOMY AND ADENOIDECTOMY       ALLERGIES:  Allergies  Allergen Reactions   Caffeine Other (See Comments)    Does not tolerate caffeine, causes migraines     CURRENT MEDICATIONS:  Outpatient Encounter Medications as of 01/26/2021  Medication Sig    levonorgestrel (MIRENA, 52 MG,) 20 MCG/24HR IUD Mirena 20 mcg/24 hours (7 yrs) 52 mg intrauterine device  Take 1 device every day by intrauterine route as directed.   melatonin 3 MG TABS tablet Take 3 mg by mouth at bedtime as needed (sleep).   tamoxifen (NOLVADEX) 20 MG tablet Take 1 tablet (20 mg total) by mouth daily.   venlafaxine XR (EFFEXOR-XR) 37.5 MG 24 hr capsule Take 1 capsule (37.5 mg total) by mouth daily with breakfast.   Facility-Administered Encounter Medications as of 01/26/2021  Medication   Sonafine emulsion 1 application     ONCOLOGIC FAMILY HISTORY:  Family History  Problem Relation Age of Onset   Fibromyalgia Mother    Crohn's disease Father    Anuerysm Sister    Melanoma Sister 4   Melanoma Maternal Grandfather 85       metastatic   Breast cancer Paternal Grandmother 43   Colon cancer Paternal Grandmother 67   Uterine cancer Cousin 75       maternal cousin     GENETIC COUNSELING/TESTING: Negative  SOCIAL HISTORY:  Social History   Socioeconomic History   Marital status: Married    Spouse name: Not on file   Number of children: Not on file   Years of education: Not on file   Highest education level: Not on file  Occupational History   Not on file  Tobacco Use  Smoking status: Never   Smokeless tobacco: Never  Vaping Use   Vaping Use: Never used  Substance and Sexual Activity   Alcohol use: Yes    Comment: occasiona   Drug use: No   Sexual activity: Not on file  Other Topics Concern   Not on file  Social History Narrative   Works at [employer].  Caffeine none.  Married, children 2.     Social Determinants of Health   Financial Resource Strain: Not on file  Food Insecurity: Not on file  Transportation Needs: Not on file  Physical Activity: Not on file  Stress: Not on file  Social Connections: Not on file  Intimate Partner Violence: Not on file     OBSERVATIONS/OBJECTIVE:  BP 115/66 (BP Location: Right Arm, Patient Position:  Sitting)    Pulse 87    Temp 98.1 F (36.7 C) (Temporal)    Resp 18    Wt 127 lb 9.6 oz (57.9 kg)    SpO2 100%    BMI 20.60 kg/m  GENERAL: Patient is a well appearing female in no acute distress HEENT:  Sclerae anicteric.  Oropharynx clear and moist. No ulcerations or evidence of oropharyngeal candidiasis. Neck is supple.  NODES:  No cervical, supraclavicular, or axillary lymphadenopathy palpated.  BREAST EXAM: Left breast status postlumpectomy and radiation no sign of local recurrence right breast is benign. LUNGS:  Clear to auscultation bilaterally.  No wheezes or rhonchi. HEART:  Regular rate and rhythm. No murmur appreciated. ABDOMEN:  Soft, nontender.  Positive, normoactive bowel sounds. No organomegaly palpated. MSK:  No focal spinal tenderness to palpation. Full range of motion bilaterally in the upper extremities. EXTREMITIES:  No peripheral edema.   SKIN:  Clear with no obvious rashes or skin changes. No nail dyscrasia. NEURO:  Nonfocal. Well oriented.  Appropriate affect.  LABORATORY DATA:  None for this visit.  DIAGNOSTIC IMAGING:  None for this visit.      ASSESSMENT AND PLAN:  Ms.. Garza is a pleasant 44 y.o. female with Stage 1A left breast invasive ductal carcinoma, ER+/PR+/HER2-, diagnosed in 07/2020, treated with lumpectomy, adjuvant radiation therapy, and anti-estrogen therapy with Tamoxifen beginning in 10/2020.  She presents to the Survivorship Clinic for our initial meeting and routine follow-up post-completion of treatment for breast cancer.    1. Stage IA left breast cancer:  Rachael Garza is continuing to recover from definitive treatment for breast cancer. She will follow-up with her medical oncologist, Dr. Verdie Garza in 6 months with history and physical exam per surveillance protocol.  She will continue her anti-estrogen therapy with Tamoixfen. Thus far, she is tolerating the Tamoxifen well, with minimal side effects. She was instructed to make Dr. Lindi Garza or myself aware  if she begins to experience any worsening side effects of the medication and I could see her back in clinic to help manage those side effects, as needed. Her mammogram is due 04/2021; orders placed today.  Her breast density is category D.  Due to her breast density and mammographically occult breast cancer, she will also undergo breast MRI in 10/2021.. Today, a comprehensive survivorship care plan and treatment summary was reviewed with the patient today detailing her breast cancer diagnosis, treatment course, potential late/long-term effects of treatment, appropriate follow-up care with recommendations for the future, and patient education resources.  A copy of this summary, along with a letter will be sent to the patients primary care provider via mail/fax/In Basket message after todays visit.    2. Bone health: She  was given education on specific activities to promote bone health.  3. Cancer screening:  Due to Rachael Garza's history and her age, she should receive screening for skin cancers, colon cancer, and gynecologic cancers.  The information and recommendations are listed on the patient's comprehensive care plan/treatment summary and were reviewed in detail with the patient.    4. Health maintenance and wellness promotion: Rachael Garza was encouraged to consume 5-7 servings of fruits and vegetables per day. We reviewed the "Nutrition Rainbow" handout.  She was also encouraged to engage in moderate to vigorous exercise for 30 minutes per day most days of the week. We discussed the [employer] fitness program, which is designed for cancer survivors to help them become more physically fit after cancer treatments.  She was instructed to limit her alcohol consumption and continue to abstain from tobacco use.     5. Support services/counseling: It is not uncommon for this period of the patient's cancer care trajectory to be one of many emotions and stressors.  She was given information regarding our  available services and encouraged to contact me with any questions or for help enrolling in any of our support group/programs.    Follow up instructions:    -Return to cancer center in 6 months for f/u with Dr. Lindi Garza  -Mammogram due in 04/2021 -Breast MRI in 10/2021 -Follow up with surgery in one year -She is welcome to return back to the Survivorship Clinic at any time; no additional follow-up needed at this time.  -Consider referral back to survivorship as a long-term survivor for continued surveillance  The patient was provided an opportunity to ask questions and all were answered. The patient agreed with the plan and demonstrated an understanding of the instructions.   Total encounter time: 45 minutes in face to face visit time, chart review, lab review, order entry, care coordination, and documentation of the encounter.    Rachael Bihari, NP 01/26/21 8:12 AM Medical Oncology and Hematology [facility] [place], [place] [address] Tel. [phone]    Fax. [phone]  *Total Encounter Time as defined by the Centers for Medicare and Medicaid Services includes, in addition to the face-to-face time of a patient visit (documented in the note above) non-face-to-face time: obtaining and reviewing outside history, ordering and reviewing medications, tests or procedures, care coordination (communications with other health care professionals or caregivers) and documentation in the medical record.

## 2021-01-28 ENCOUNTER — Ambulatory Visit
Admission: RE | Admit: 2021-01-28 | Discharge: 2021-01-28 | Disposition: A | Discharge status: Home / Self Care | Payer: No Typology Code available for payment source | Source: Ambulatory Visit | Attending: Internal Medicine | Admitting: Internal Medicine

## 2021-01-28 DIAGNOSIS — Z8249 Family history of ischemic heart disease and other diseases of the circulatory system: Secondary | ICD-10-CM

## 2021-01-28 DIAGNOSIS — Z0000 Encounter for general adult medical examination without abnormal findings: Secondary | ICD-10-CM

## 2021-02-03 ENCOUNTER — Inpatient Hospital Stay: Payer: BC Managed Care – PPO | Admitting: Adult Health

## 2021-02-04 ENCOUNTER — Ambulatory Visit: Payer: BC Managed Care – PPO | Attending: General Surgery | Admitting: Rehabilitation

## 2021-02-04 ENCOUNTER — Other Ambulatory Visit: Payer: Self-pay

## 2021-02-04 DIAGNOSIS — Z170 Estrogen receptor positive status [ER+]: Secondary | ICD-10-CM

## 2021-02-04 DIAGNOSIS — C50412 Malignant neoplasm of upper-outer quadrant of left female breast: Secondary | ICD-10-CM | POA: Insufficient documentation

## 2021-02-04 DIAGNOSIS — R293 Abnormal posture: Secondary | ICD-10-CM

## 2021-02-04 DIAGNOSIS — Z483 Aftercare following surgery for neoplasm: Secondary | ICD-10-CM

## 2021-02-04 DIAGNOSIS — L599 Disorder of the skin and subcutaneous tissue related to radiation, unspecified: Secondary | ICD-10-CM

## 2021-02-04 NOTE — Therapy (Signed)
[facility] @ [place], [place], [address] Phone: [phone]   Fax:  [phone]  Physical Therapy Treatment  Patient Details  Name: Rachael Garza MRN: [id number] Date of Birth: 1975-07-01 Referring Provider (PT): Dr. Autumn Messing   Encounter Date: 02/04/2021   PT End of Session - 02/04/21 1220     Visit Number 3    Number of Visits 3    PT Start Time 1203    PT Stop Time 1215    PT Time Calculation (min) 12 min    Activity Tolerance Patient tolerated treatment well    Behavior During Therapy [facility] for tasks assessed/performed             Past Medical History:  Diagnosis Date   Cancer [facility])    Encounter for insertion of mirena IUD    Family history of breast cancer 08/07/2020   Family history of colon cancer 08/07/2020   Family history of melanoma 08/07/2020   History of acne    History of radiation therapy    Left breast 10/14/20-11/12/20 Dr. Gery Pray   Migraines    Situational stress     Past Surgical History:  Procedure Laterality Date   ? compression fracture L4 in college due to fall     BREAST LUMPECTOMY WITH RADIOACTIVE SEED LOCALIZATION Bilateral 09/09/2020   Procedure: LEFT BREAST RADIOACTIVE SEED LOCALIZATION LUMPECTOMY WITH SENITNEL LYMPH NODE, AND RIGHT RADIOACTIVE SEED LOCALIZATION LUMPECTOMY x2;  Surgeon: Jovita Kussmaul, MD;  Location: [facility];  Service: General;  Laterality: Bilateral;   TONSILLECTOMY AND ADENOIDECTOMY      There were no vitals filed for this visit.   Subjective Assessment - 02/04/21 1216     Subjective still feels fine    Pertinent History Rt lumpectomy x 2 due to [place], Lt lumpectomy wih 0/3 nodes positive.  Currently in radiation and will then have antiestrogens x 10 years    Patient Stated Goals Reduce lymphedema risk and learn post op shoulder ROM HEP    Currently in Pain? No/denies                    L-DEX FLOWSHEETS - 02/04/21 1200        L-DEX LYMPHEDEMA SCREENING   Measurement Type Unilateral    L-DEX MEASUREMENT EXTREMITY Upper Extremity    POSITION  Standing    DOMINANT SIDE Right    At Risk Side Left    BASELINE SCORE (UNILATERAL) -4.1    L-DEX SCORE (UNILATERAL) 0.5    VALUE CHANGE (UNILAT) 4.6                                     PT Long Term Goals - 10/28/20 7858       PT LONG TERM GOAL #1   Title Pt will demonstrate no PT needs in regards to breast cancer healing and side effects    Time 6    Period Weeks    Status New      PT LONG TERM GOAL #2   Title NA      PT LONG TERM GOAL #3   Title NA      PT LONG TERM GOAL #4   Title NA      PT LONG TERM GOAL #5   Title Patient will demonstrate she has regainde full shoulder ROM post operatively compared to baseline  assessment.    Status Achieved                   Plan - 02/04/21 1220     Clinical Impression Statement SOZO is back in green after another month.  Pt will now repeat every 3 months and wear the sleeve as needed for high activity and flying.    PT Next Visit Plan repeat SOZO    Consulted and Agree with Plan of Care Patient             Patient will benefit from skilled therapeutic intervention in order to improve the following deficits and impairments:     Visit Diagnosis: Malignant neoplasm of upper-outer quadrant of left breast in female, estrogen receptor positive ([place])  Aftercare following surgery for neoplasm  Disorder of the skin and subcutaneous tissue related to radiation, unspecified  Abnormal posture     Problem List Patient Active Problem List   Diagnosis Date Noted   Genetic testing 08/14/2020   Family history of breast cancer 08/07/2020   Family history of colon cancer 08/07/2020   Family history of melanoma 08/07/2020   Malignant neoplasm of upper-outer quadrant of left breast in female, estrogen receptor positive ([place]) 08/01/2020   Migraine without aura and without status  migrainosus, not intractable 03/02/2020   Cervicalgia 03/02/2020   Bilateral occipital neuralgia 03/02/2020    Stark Bray, PT 02/04/2021, 12:22 PM  [facility] @ [place], [place], [address] Phone: [phone]   Fax:  [phone]  Name: Rachael Garza MRN: [id number] Date of Birth: 07/15/1975

## 2021-02-05 ENCOUNTER — Encounter: Payer: BC Managed Care – PPO | Admitting: Adult Health

## 2021-02-19 DIAGNOSIS — R0789 Other chest pain: Secondary | ICD-10-CM | POA: Diagnosis not present

## 2021-02-19 DIAGNOSIS — C50412 Malignant neoplasm of upper-outer quadrant of left female breast: Secondary | ICD-10-CM | POA: Diagnosis not present

## 2021-02-20 ENCOUNTER — Other Ambulatory Visit: Payer: Self-pay

## 2021-02-20 ENCOUNTER — Ambulatory Visit (AMBULATORY_SURGERY_CENTER): Payer: BC Managed Care – PPO | Admitting: *Deleted

## 2021-02-20 VITALS — Ht 66.0 in | Wt 123.0 lb

## 2021-02-20 DIAGNOSIS — Z1211 Encounter for screening for malignant neoplasm of colon: Secondary | ICD-10-CM

## 2021-02-20 MED ORDER — PLENVU 140 G PO SOLR: 1.0000 | Freq: Once | ORAL | 0 refills | Status: AC

## 2021-02-20 MED ORDER — PLENVU 140 G PO SOLR
1.0000 | Freq: Once | ORAL | 0 refills | Status: DC
Start: 2021-02-20 — End: 2021-02-20

## 2021-02-20 NOTE — Progress Notes (Signed)
No egg or soy allergy known to patient  No issues known to pt with past sedation with any surgeries or procedures Patient denies ever being told they had issues or difficulty with intubation  No FH of Malignant Hyperthermia Pt is not on diet pills Pt is not on  home 02  Pt is not on blood thinners  Pt denies issues with constipation  No A fib or A flutter  Pt is fully vaccinated  for Covid   plenvu Coupon to pt in PV today , Code to Pharmacy and  NO PA's for preps discussed with pt In PV today  Discussed with pt there will be an out-of-pocket cost for prep and that varies from $0 to 70 +  dollars - pt verbalized understanding   Due to the COVID-19 pandemic we are asking patients to follow certain guidelines in PV and the Ward   Pt aware of COVID protocols and [facility] guidelines   PV completed over the phone. Pt verified name, DOB, address and insurance during PV today.  Pt mailed instruction packet with copy of consent form to read and not return, and instructions.  Pt encouraged to call with questions or issues.  If pt has My chart, procedure instructions sent via My Chart

## 2021-02-23 ENCOUNTER — Encounter: Payer: Self-pay | Admitting: Gastroenterology

## 2021-02-26 DIAGNOSIS — L570 Actinic keratosis: Secondary | ICD-10-CM | POA: Diagnosis not present

## 2021-02-26 DIAGNOSIS — D225 Melanocytic nevi of trunk: Secondary | ICD-10-CM | POA: Diagnosis not present

## 2021-02-26 DIAGNOSIS — L0100 Impetigo, unspecified: Secondary | ICD-10-CM | POA: Diagnosis not present

## 2021-02-26 DIAGNOSIS — L309 Dermatitis, unspecified: Secondary | ICD-10-CM | POA: Diagnosis not present

## 2021-02-26 DIAGNOSIS — L821 Other seborrheic keratosis: Secondary | ICD-10-CM | POA: Diagnosis not present

## 2021-03-02 DIAGNOSIS — E7800 Pure hypercholesterolemia, unspecified: Secondary | ICD-10-CM | POA: Diagnosis not present

## 2021-03-04 ENCOUNTER — Ambulatory Visit (AMBULATORY_SURGERY_CENTER): Payer: BC Managed Care – PPO | Admitting: Gastroenterology

## 2021-03-04 ENCOUNTER — Encounter: Payer: Self-pay | Admitting: Gastroenterology

## 2021-03-04 VITALS — BP 116/68 | HR 67 | Temp 98.4°F | Resp 12 | Ht 66.0 in | Wt 123.0 lb

## 2021-03-04 DIAGNOSIS — K635 Polyp of colon: Secondary | ICD-10-CM

## 2021-03-04 DIAGNOSIS — D120 Benign neoplasm of cecum: Secondary | ICD-10-CM

## 2021-03-04 DIAGNOSIS — Z1211 Encounter for screening for malignant neoplasm of colon: Secondary | ICD-10-CM

## 2021-03-04 DIAGNOSIS — D122 Benign neoplasm of ascending colon: Secondary | ICD-10-CM

## 2021-03-04 MED ORDER — SODIUM CHLORIDE 0.9 % IV SOLN: 500.0000 mL | Freq: Once | INTRAVENOUS | Status: DC

## 2021-03-04 NOTE — Progress Notes (Signed)
[facility] History and Physical   Primary Care Physician:  Shon Baton, MD   Reason for Procedure:   Colon cancer screening  Plan:    colonoscopy     HPI: Rachael Garza is a 46 y.o. female  here for colonoscopy screening. Patient denies any bowel symptoms at this time. Grandparent had colon cancer, no other family history. She does have a history of breast cancer in remission. Otherwise feels well without any cardiopulmonary symptoms.    Past Medical History:  Diagnosis Date   Anxiety    Cancer ([place])    left   Encounter for insertion of mirena IUD    Family history of breast cancer 08/07/2020   Family history of colon cancer 08/07/2020   Family history of melanoma 08/07/2020   History of acne    History of radiation therapy    Left breast 10/14/20-11/12/20 Dr. Gery Pray   Migraines    Situational stress     Past Surgical History:  Procedure Laterality Date   ? compression fracture L4 in college due to fall     BREAST LUMPECTOMY WITH RADIOACTIVE SEED LOCALIZATION Bilateral 09/09/2020   Procedure: LEFT BREAST RADIOACTIVE SEED LOCALIZATION LUMPECTOMY WITH SENITNEL LYMPH NODE, AND RIGHT RADIOACTIVE SEED LOCALIZATION LUMPECTOMY x2;  Surgeon: Jovita Kussmaul, MD;  Location: [facility];  Service: General;  Laterality: Bilateral;   TONSILLECTOMY AND ADENOIDECTOMY      Prior to Admission medications   Medication Sig Start Date End Date Taking? Authorizing Provider  melatonin 3 MG TABS tablet Take 3 mg by mouth at bedtime as needed (sleep). 03/06/19  Yes [provider]  tamoxifen (NOLVADEX) 20 MG tablet Take 1 tablet (20 mg total) by mouth daily. 11/27/20  Yes Nicholas Lose, MD  venlafaxine XR (EFFEXOR-XR) 37.5 MG 24 hr capsule Take 1 capsule (37.5 mg total) by mouth daily with breakfast. 09/22/20  Yes Nicholas Lose, MD  levonorgestrel (MIRENA, 52 MG,) 20 MCG/24HR IUD Mirena 20 mcg/24 hours (7 yrs) 52 mg intrauterine device  Take 1 device every day by  intrauterine route as directed.    [provider]  valACYclovir (VALTREX) 500 MG tablet Take 1 tablet po 2x a day prn at 1st sign of cold sores 12/08/20   [provider]    Current Outpatient Medications  Medication Sig Dispense Refill   melatonin 3 MG TABS tablet Take 3 mg by mouth at bedtime as needed (sleep).     tamoxifen (NOLVADEX) 20 MG tablet Take 1 tablet (20 mg total) by mouth daily. 90 tablet 3   venlafaxine XR (EFFEXOR-XR) 37.5 MG 24 hr capsule Take 1 capsule (37.5 mg total) by mouth daily with breakfast. 30 capsule 6   levonorgestrel (MIRENA, 52 MG,) 20 MCG/24HR IUD Mirena 20 mcg/24 hours (7 yrs) 52 mg intrauterine device  Take 1 device every day by intrauterine route as directed.     valACYclovir (VALTREX) 500 MG tablet Take 1 tablet po 2x a day prn at 1st sign of cold sores     Current Facility-Administered Medications  Medication Dose Route Frequency Provider Last Rate Last Admin   0.9 %  sodium chloride infusion  500 mL Intravenous Once Sanvi Ehler, Carlota Raspberry, MD       Facility-Administered Medications Ordered in Other Visits  Medication Dose Route Frequency Provider Last Rate Last Admin   Sonafine emulsion 1 application  1 application Topical Once Gery Pray, MD        Allergies as of 03/04/2021 - Review Complete 03/04/2021  Allergen Reaction Noted   Caffeine Other (See Comments) 09/09/2020    Family History  Problem Relation Age of Onset   Fibromyalgia Mother    Crohn's disease Father    Anuerysm Sister    Melanoma Sister 44   Melanoma Maternal Grandfather 4       metastatic   Breast cancer Paternal Grandmother 23   Colon cancer Paternal Grandmother 17   Uterine cancer Cousin 10       maternal cousin   Esophageal cancer Neg Hx    Rectal cancer Neg Hx    Stomach cancer Neg Hx     Social History   Socioeconomic History   Marital status: Married    Spouse name: Not on file   Number of children: Not on file   Years of education:  Not on file   Highest education level: Not on file  Occupational History   Not on file  Tobacco Use   Smoking status: Never   Smokeless tobacco: Never  Vaping Use   Vaping Use: Never used  Substance and Sexual Activity   Alcohol use: Yes    Comment: occasional   Drug use: No   Sexual activity: Not on file  Other Topics Concern   Not on file  Social History Narrative   Works at [employer].  Caffeine none.  Married, children 2.     Social Determinants of Health   Financial Resource Strain: Low Risk    Difficulty of Paying Living Expenses: Not hard at all  Food Insecurity: No Food Insecurity   Worried About Charity fundraiser in the Last Year: Never true   [place] in the Last Year: Never true  Transportation Needs: No Transportation Needs   Lack of Transportation (Medical): No   Lack of Transportation (Non-Medical): No  Physical Activity: Sufficiently Active   Days of Exercise per Week: 4 days   Minutes of Exercise per Session: 40 min  Stress: No Stress Concern Present   Feeling of Stress : Only a little  Social Connections: Moderately Integrated   Frequency of Communication with Friends and Family: More than three times a week   Frequency of Social Gatherings with Friends and Family: Three times a week   Attends Religious Services: 1 to 4 times per year   Active Member of Clubs or Organizations: No   Attends Archivist Meetings: Never   Marital Status: Married  Human resources officer Violence: Not At Risk   Fear of Current or Ex-Partner: No   Emotionally Abused: No   Physically Abused: No   Sexually Abused: No    Review of Systems: All other review of systems negative except as mentioned in the HPI.  Physical Exam: Vital signs BP 94/66    Pulse (!) 105    Temp 98.4 F (36.9 C)    Ht 5\' 6"  (1.676 m)    Wt 123 lb (55.8 kg)    SpO2 98%    BMI 19.85 kg/m   General:   Alert,  Well-developed, pleasant and cooperative in NAD Lungs:  Clear throughout to  auscultation.   Heart:  Regular rate and rhythm Abdomen:  Soft, nontender and nondistended.   Neuro/Psych:  Alert and cooperative. Normal mood and affect. A and O x 3  Jolly Mango, MD [facility]

## 2021-03-04 NOTE — Progress Notes (Signed)
C.W. vital signs. 

## 2021-03-04 NOTE — Op Note (Signed)
[facility] Patient Name: Rachael Garza Procedure Date: 03/04/2021 11:52 AM MRN: [id number] Endoscopist: Remo Lipps P. Havery Moros , MD Age: 46 Referring MD:  Date of Birth: Mar 08, 1975 Gender: Female Account #: [id number] Procedure:                Colonoscopy Indications:              Screening for colorectal malignant neoplasm, This                            is the patient's first colonoscopy Medicines:                Monitored Anesthesia Care Procedure:                Pre-Anesthesia Assessment:                           - Prior to the procedure, a History and Physical                            was performed, and patient medications and                            allergies were reviewed. The patient's tolerance of                            previous anesthesia was also reviewed. The risks                            and benefits of the procedure and the sedation                            options and risks were discussed with the patient.                            All questions were answered, and informed consent                            was obtained. Prior Anticoagulants: The patient has                            taken no previous anticoagulant or antiplatelet                            agents. ASA Grade Assessment: I - A normal, healthy                            patient. After reviewing the risks and benefits,                            the patient was deemed in satisfactory condition to                            undergo the procedure.  After obtaining informed consent, the colonoscope                            was passed under direct vision. Throughout the                            procedure, the patient's blood pressure, pulse, and                            oxygen saturations were monitored continuously. The                            PCF-HQ190L Colonoscope was introduced through the                            anus and advanced to the the  cecum, identified by                            appendiceal orifice and ileocecal valve. The                            colonoscopy was performed without difficulty. The                            patient tolerated the procedure well. The quality                            of the bowel preparation was good. The ileocecal                            valve, appendiceal orifice, and rectum were                            photographed. Scope In: 12:03:13 PM Scope Out: 12:24:12 PM Scope Withdrawal Time: 0 hours 16 minutes 40 seconds  Total Procedure Duration: 0 hours 20 minutes 59 seconds  Findings:                 The perianal and digital rectal examinations were                            normal.                           A 3 mm polyp was found in the cecum. The polyp was                            sessile. The polyp was removed with a cold snare.                            Resection and retrieval were complete.                           A 3 mm polyp was found in the ascending colon. The  polyp was sessile. The polyp was removed with a                            cold snare. Resection and retrieval were complete.                           A few small-mouthed diverticula were found in the                            sigmoid colon.                           A single small angiodysplastic lesion was found in                            the rectum.                           Internal hemorrhoids were found during                            retroflexion. The hemorrhoids were small.                           The exam was otherwise without abnormality. Complications:            No immediate complications. Estimated blood loss:                            Minimal. Estimated Blood Loss:     Estimated blood loss was minimal. Impression:               - One 3 mm polyp in the cecum, removed with a cold                            snare. Resected and retrieved.                            - One 3 mm polyp in the ascending colon, removed                            with a cold snare. Resected and retrieved.                           - Diverticulosis in the sigmoid colon.                           - A single colonic angiodysplastic lesion.                           - Internal hemorrhoids.                           - The examination was otherwise normal. Recommendation:           - Patient has a contact number available for  emergencies. The signs and symptoms of potential                            delayed complications were discussed with the                            patient. Return to normal activities tomorrow.                            Written discharge instructions were provided to the                            patient.                           - Resume previous diet.                           - Continue present medications.                           - Await pathology results. Remo Lipps P. Jarrel Knoke, MD 03/04/2021 12:31:13 PM This report has been signed electronically.

## 2021-03-04 NOTE — Progress Notes (Signed)
Pt's states no medical or surgical changes since previsit or office visit. 

## 2021-03-04 NOTE — Progress Notes (Signed)
Called to room to assist during endoscopic procedure.  Patient ID and intended procedure confirmed with present staff. Received instructions for my participation in the procedure from the performing physician.  

## 2021-03-04 NOTE — Progress Notes (Signed)
Pt awake, report to RN, VVS  °

## 2021-03-04 NOTE — Patient Instructions (Signed)
Handouts on polyps,diverticulosis ,and hemorrhoids given to you today    Await pathology results on polyps removed    YOU HAD AN ENDOSCOPIC PROCEDURE TODAY AT [facility]:   Refer to the procedure report that was given to you for any specific questions about what was found during the examination.  If the procedure report does not answer your questions, please call your gastroenterologist to clarify.  If you requested that your care partner not be given the details of your procedure findings, then the procedure report has been included in a sealed envelope for you to review at your convenience later.  YOU SHOULD EXPECT: Some feelings of bloating in the abdomen. Passage of more gas than usual.  Walking can help get rid of the air that was put into your GI tract during the procedure and reduce the bloating. If you had a lower endoscopy (such as a colonoscopy or flexible sigmoidoscopy) you may notice spotting of blood in your stool or on the toilet paper. If you underwent a bowel prep for your procedure, you may not have a normal bowel movement for a few days.  Please Note:  You might notice some irritation and congestion in your nose or some drainage.  This is from the oxygen used during your procedure.  There is no need for concern and it should clear up in a day or so.  SYMPTOMS TO REPORT IMMEDIATELY:  Following lower endoscopy (colonoscopy or flexible sigmoidoscopy):  Excessive amounts of blood in the stool  Significant tenderness or worsening of abdominal pains  Swelling of the abdomen that is new, acute  Fever of 100F or higher   For urgent or emergent issues, a gastroenterologist can be reached at any hour by calling [phone]. Do not use MyChart messaging for urgent concerns.    DIET:  We do recommend a small meal at first, but then you may proceed to your regular diet.  Drink plenty of fluids but you should avoid alcoholic beverages for 24 hours.  ACTIVITY:   You should plan to take it easy for the rest of today and you should NOT DRIVE or use heavy machinery until tomorrow (because of the sedation medicines used during the test).    FOLLOW UP: Our staff will call the number listed on your records 48-72 hours following your procedure to check on you and address any questions or concerns that you may have regarding the information given to you following your procedure. If we do not reach you, we will leave a message.  We will attempt to reach you two times.  During this call, we will ask if you have developed any symptoms of COVID 19. If you develop any symptoms (ie: fever, flu-like symptoms, shortness of breath, cough etc.) before then, please call [phone].  If you test positive for Covid 19 in the 2 weeks post procedure, please call and report this information to [place].    If any biopsies were taken you will be contacted by phone or by letter within the next 1-3 weeks.  Please call us at [phone] if you have not heard about the biopsies in 3 weeks.    SIGNATURES/CONFIDENTIALITY: You and/or your care partner have signed paperwork which will be entered into your electronic medical record.  These signatures attest to the fact that that the information above on your After Visit Summary has been reviewed and is understood.  Full responsibility of the confidentiality of this discharge information lies with you and/or  your care-partner.

## 2021-03-05 ENCOUNTER — Other Ambulatory Visit: Payer: Self-pay | Admitting: *Deleted

## 2021-03-05 MED ORDER — VENLAFAXINE HCL ER 37.5 MG PO CP24: 37.5000 mg | ORAL_CAPSULE | Freq: Every day | ORAL | 12 refills | Status: DC

## 2021-03-05 MED ORDER — TAMOXIFEN CITRATE 20 MG PO TABS: 20.0000 mg | ORAL_TABLET | Freq: Every day | ORAL | 3 refills | Status: DC

## 2021-03-06 ENCOUNTER — Telehealth: Payer: Self-pay

## 2021-03-06 NOTE — Telephone Encounter (Signed)
°  Follow up Call-  Call back number 03/04/2021  Post procedure Call Back phone  # [phone]  Permission to leave phone message Yes  Some recent data might be hidden     Patient questions:  Do you have a fever, pain , or abdominal swelling? No. Pain Score  0 *  Have you tolerated food without any problems? Yes.    Have you been able to return to your normal activities? Yes.    Do you have any questions about your discharge instructions: Diet   No. Medications  No. Follow up visit  No.  Do you have questions or concerns about your Care? No.  Actions: * If pain score is 4 or above: No action needed, pain <4.

## 2021-03-09 DIAGNOSIS — Z1331 Encounter for screening for depression: Secondary | ICD-10-CM | POA: Diagnosis not present

## 2021-03-09 DIAGNOSIS — Z0000 Encounter for general adult medical examination without abnormal findings: Secondary | ICD-10-CM | POA: Diagnosis not present

## 2021-03-09 DIAGNOSIS — Z1339 Encounter for screening examination for other mental health and behavioral disorders: Secondary | ICD-10-CM | POA: Diagnosis not present

## 2021-03-09 DIAGNOSIS — C50412 Malignant neoplasm of upper-outer quadrant of left female breast: Secondary | ICD-10-CM | POA: Diagnosis not present

## 2021-04-09 ENCOUNTER — Ambulatory Visit
Admission: RE | Admit: 2021-04-09 | Discharge: 2021-04-09 | Disposition: A | Discharge status: Home / Self Care | Payer: BC Managed Care – PPO | Source: Ambulatory Visit | Attending: Adult Health | Admitting: Adult Health

## 2021-04-09 ENCOUNTER — Other Ambulatory Visit: Payer: Self-pay

## 2021-04-09 DIAGNOSIS — C50412 Malignant neoplasm of upper-outer quadrant of left female breast: Secondary | ICD-10-CM

## 2021-04-09 DIAGNOSIS — R922 Inconclusive mammogram: Secondary | ICD-10-CM | POA: Diagnosis not present

## 2021-04-09 DIAGNOSIS — Z170 Estrogen receptor positive status [ER+]: Secondary | ICD-10-CM

## 2021-04-13 DIAGNOSIS — R87616 Satisfactory cervical smear but lacking transformation zone: Secondary | ICD-10-CM | POA: Diagnosis not present

## 2021-04-13 DIAGNOSIS — Z01419 Encounter for gynecological examination (general) (routine) without abnormal findings: Secondary | ICD-10-CM | POA: Diagnosis not present

## 2021-04-13 DIAGNOSIS — Z124 Encounter for screening for malignant neoplasm of cervix: Secondary | ICD-10-CM | POA: Diagnosis not present

## 2021-04-22 ENCOUNTER — Encounter: Payer: Self-pay | Admitting: Rehabilitation

## 2021-04-27 ENCOUNTER — Telehealth: Payer: Self-pay

## 2021-04-27 ENCOUNTER — Encounter: Payer: Self-pay | Admitting: Physical Therapy

## 2021-04-27 ENCOUNTER — Encounter: Payer: Self-pay | Admitting: Adult Health

## 2021-04-27 NOTE — Telephone Encounter (Signed)
Called patient regarding her Mychart message about tapering Effexor XR. The patient explained that she understands it helps with side effects of Tamoxifen, but would like to try and function without taking the Effexor. I went over the instructions for tapering the Effexor with the patient, and she verbalized understanding of this. I informed the patient of the side effects she could experience while tapering, and that she should call us if those are extreme. She had no further questions or concerns.  ?

## 2021-04-27 NOTE — Therapy (Signed)
[facility] ?[place], [place], [address] ?Phone: [phone]   Fax:  [phone] ? ?Patient Details  ?Name: Rachael Garza ?MRN: [id number] ?Date of Birth: 05/12/1975 ?Referring Provider (PT): Shon Baton, MD ? ?Encounter Date: 04/27/2021 ? ?VESTIBULAR REHAB PHYSICAL THERAPY DISCHARGE SUMMARY ? ?Visits from Start of Care: 2 ? ?Current functional level related to goals / functional outcomes: ?Unable to formally assess progress towards vestibular goals.  Pt cancelled remaining vestibular rehab visits after 2 sessions. ?  ?Remaining deficits: ?Dizziness ?  ?Education / Equipment: ?HEP  ? ?Patient agrees to discharge. Patient goals were  unknown . Patient is being discharged due to not returning since the last visit. ? ?Rico Junker, PT, DPT ?04/27/21    10:30 AM ? ? ?[facility] [place] ?[place] ?[place], [place], [address] ?Phone: [phone]   Fax:  [phone] ?

## 2021-05-04 ENCOUNTER — Ambulatory Visit: Payer: BC Managed Care – PPO | Attending: General Surgery

## 2021-05-04 ENCOUNTER — Other Ambulatory Visit: Payer: Self-pay

## 2021-05-04 VITALS — Wt 132.0 lb

## 2021-05-04 DIAGNOSIS — Z483 Aftercare following surgery for neoplasm: Secondary | ICD-10-CM | POA: Insufficient documentation

## 2021-05-04 NOTE — Therapy (Signed)
?  OUTPATIENT PHYSICAL THERAPY SOZO SCREENING NOTE ? ? ?Patient Name: Rachael Garza ?MRN: [id number] ?DOB:15-Mar-1975, 46 y.o., female ?Today's Date: 05/04/2021 ? ?PCP: Shon Baton, MD ?REFERRING PROVIDER: Jovita Kussmaul, MD ? ? PT End of Session - 05/04/21 1100   ? ? Visit Number 1   ? PT Start Time 1055   ? PT Stop Time 2197   ? PT Time Calculation (min) 8 min   ? Activity Tolerance Patient tolerated treatment well   ? Behavior During Therapy [facility] for tasks assessed/performed   ? ?  ?  ? ?  ? ? ?Past Medical History:  ?Diagnosis Date  ? Anxiety   ? Cancer [facility])   ? left  ? Encounter for insertion of mirena IUD   ? Family history of breast cancer 08/07/2020  ? Family history of colon cancer 08/07/2020  ? Family history of melanoma 08/07/2020  ? History of acne   ? History of radiation therapy   ? Left breast 10/14/20-11/12/20 Dr. Gery Pray  ? Migraines   ? Situational stress   ? ?Past Surgical History:  ?Procedure Laterality Date  ? ? compression fracture L4 in college due to fall    ? BREAST EXCISIONAL BIOPSY Right 09/09/2020  ? [place]  ? BREAST LUMPECTOMY Left 09/09/2020  ? BREAST LUMPECTOMY WITH RADIOACTIVE SEED LOCALIZATION Bilateral 09/09/2020  ? Procedure: LEFT BREAST RADIOACTIVE SEED LOCALIZATION LUMPECTOMY WITH SENITNEL LYMPH NODE, AND RIGHT RADIOACTIVE SEED LOCALIZATION LUMPECTOMY x2;  Surgeon: Jovita Kussmaul, MD;  Location: [facility];  Service: General;  Laterality: Bilateral;  ? TONSILLECTOMY AND ADENOIDECTOMY    ? ?Patient Active Problem List  ? Diagnosis Date Noted  ? Genetic testing 08/14/2020  ? Family history of breast cancer 08/07/2020  ? Family history of colon cancer 08/07/2020  ? Family history of melanoma 08/07/2020  ? Malignant neoplasm of upper-outer quadrant of left breast in female, estrogen receptor positive ([place]) 08/01/2020  ? Migraine without aura and without status migrainosus, not intractable 03/02/2020  ? Cervicalgia 03/02/2020  ? Bilateral occipital neuralgia 03/02/2020  ? ? ?REFERRING  DIAG: right breast cancer at risk for lymphedema ? ?THERAPY DIAG: Aftercare following surgery for neoplasm ? ?PERTINENT HISTORY: Rt lumpectomy x 2 due to [place], Lt lumpectomy wih 0/3 nodes positive.  Currently in radiation and will then have antiestrogens x 10 years  ? ?PRECAUTIONS: right UE Lymphedema risk, None ? ?SUBJECTIVE: Pt returns for her 3 month L-Dex screen . ? ?PAIN:  ?Are you having pain? No ? ?SOZO SCREENING: ?Patient was assessed today using the SOZO machine to determine the lymphedema index score. This was compared to her baseline score. It was determined that she is within the recommended range when compared to her baseline and no further action is needed at this time. She will continue SOZO screenings. These are done every 3 months for 2 years post operatively followed by every 6 months for 2 years, and then annually. ? ?Plan: Cont every 3 month L-Dex screens for up to 2 years from her SLNB (~10/07/2022) ? ? ? ?Otelia Limes, PTA ?05/04/2021, 11:03 AM ? ?  ? ?

## 2021-05-20 ENCOUNTER — Encounter (HOSPITAL_COMMUNITY): Payer: Self-pay

## 2021-05-25 ENCOUNTER — Encounter: Payer: Self-pay | Admitting: Hematology and Oncology

## 2021-05-27 ENCOUNTER — Encounter (HOSPITAL_COMMUNITY): Payer: Self-pay

## 2021-07-13 ENCOUNTER — Encounter: Payer: Self-pay | Admitting: Hematology and Oncology

## 2021-07-13 ENCOUNTER — Other Ambulatory Visit: Payer: Self-pay | Admitting: *Deleted

## 2021-07-13 MED ORDER — TAMOXIFEN CITRATE 20 MG PO TABS: 20.0000 mg | ORAL_TABLET | Freq: Every day | ORAL | 0 refills | Status: DC

## 2021-07-13 NOTE — Progress Notes (Signed)
Patient Care Team: Shon Baton, MD as PCP - General (Internal Medicine) Gery Pray, MD as Consulting Physician (Radiation Oncology) Nicholas Lose, MD as Consulting Physician (Hematology and Oncology) Jovita Kussmaul, MD as Consulting Physician (General Surgery)  DIAGNOSIS:  Encounter Diagnosis  Name Primary?   Malignant neoplasm of upper-outer quadrant of left breast in female, estrogen receptor positive ([place])     SUMMARY OF ONCOLOGIC HISTORY: Oncology History  Malignant neoplasm of upper-outer quadrant of left breast in female, estrogen receptor positive ([place])  07/29/2020 Initial Diagnosis    Palpable left breast mass (very dense breasts), Mammogram revealed 2.9 cm masss, MRI 3.2 cm mass: Biopsy Grade 2 IDC ER 95%, PR 95%, Her 2 Neg, Ki 67 15% MRI on Right breast: 3.7 cm NME LIQ and 1.2 cm NME UIQ: Needs biopsies   08/06/2020 Cancer Staging   Staging form: Breast, AJCC 8th Edition - Clinical stage from 08/06/2020: Stage IB (cT2, cN0, cM0, G2, ER+, PR+, HER2-) - Signed by Nicholas Lose, MD on 08/06/2020 Stage prefix: Initial diagnosis Histologic grading system: 3 grade system Laterality: Left Staged by: Pathologist and managing physician Stage used in treatment planning: Yes National guidelines used in treatment planning: Yes Type of national guideline used in treatment planning: NCCN   08/13/2020 Genetic Testing   No pathogenic variants detected in [facility] BRCAPlus Panel or CancerNext-Expanded +RNAinsight Panel.  Variant of uncertain significance detected in BLM at  p.A914T (c.2740G>A).  The report dates are August 13, 2020 and August 18, 2020, respectively.    The BRCAplus panel offered by [employer] and includes sequencing and deletion/duplication analysis for the following 8 genes: ATM, BRCA1, BRCA2, CDH1, CHEK2, PALB2, PTEN, and TP53.   The CancerNext-Expanded gene panel offered by [facility] and includes sequencing, rearrangement, and RNA analysis for the following 77 genes:  AIP, ALK, APC, ATM, AXIN2, BAP1, BARD1, BLM, BMPR1A, BRCA1, BRCA2, BRIP1, CDC73, CDH1, CDK4, CDKN1B, CDKN2A, CHEK2, CTNNA1, DICER1, FANCC, FH, FLCN, GALNT12, KIF1B, LZTR1, MAX, MEN1, MET, MLH1, MSH2, MSH3, MSH6, MUTYH, NBN, NF1, NF2, NTHL1, PALB2, PHOX2B, PMS2, POT1, PRKAR1A, PTCH1, PTEN, RAD51C, RAD51D, RB1, RECQL, RET, SDHA, SDHAF2, SDHB, SDHC, SDHD, SMAD4, SMARCA4, SMARCB1, SMARCE1, STK11, SUFU, TMEM127, TP53, TSC1, TSC2, VHL and XRCC2 (sequencing and deletion/duplication); EGFR, EGLN1, HOXB13, KIT, MITF, PDGFRA, POLD1, and POLE (sequencing only); EPCAM and GREM1 (deletion/duplication only).    09/09/2020 Surgery   Right lumpectomy: [place] Right lumpectomy 2.: PASH Left lumpectomy: Grade 2 IDC 2.9 cm, intermediate grade DCIS, margins negative, 0/3 lymph nodes negative, ER 95%, PR 95%, HER2 equal vocal FISH negative, Ki-67 15%   09/09/2020 Oncotype testing   15/4%   09/22/2020 Cancer Staging   Staging form: Breast, AJCC 8th Edition - Pathologic: Stage IA (pT2, pN0, cM0, G2, ER+, PR+, HER2-, Oncotype DX score: 15) - Signed by Nicholas Lose, MD on 09/22/2020 Stage prefix: Initial diagnosis Multigene prognostic tests performed: Oncotype DX Recurrence score range: Greater than or equal to 11 Histologic grading system: 3 grade system   10/15/2020 - 11/11/2020 Radiation Therapy   Adjuvant radiation   11/27/2020 -  Anti-estrogen oral therapy   Tamoxifen 20 mg daily     CHIEF COMPLIANT: Follow-up of left breast cancer on Tamoxifen  INTERVAL HISTORY: Rachael Garza is a 46 y.o. with above-mentioned history of left breast cancer. She presents to the clinic today for follow-up. She states that she has some hot flashes. States that she has gained at least 15 pounds. She states that she had some really bad constipation. States that  she takes magnesium for relief. States that she has some fatigue.  ALLERGIES:  is allergic to caffeine.  MEDICATIONS:  Current Outpatient Medications  Medication Sig  Dispense Refill   levonorgestrel (MIRENA, 52 MG,) 20 MCG/24HR IUD Mirena 20 mcg/24 hours (7 yrs) 52 mg intrauterine device  Take 1 device every day by intrauterine route as directed.     melatonin 3 MG TABS tablet Take 3 mg by mouth at bedtime as needed (sleep).     tamoxifen (NOLVADEX) 20 MG tablet Take 1 tablet (20 mg total) by mouth daily. 90 tablet 3   tamoxifen (NOLVADEX) 20 MG tablet Take 1 tablet (20 mg total) by mouth daily. 90 tablet 0   valACYclovir (VALTREX) 500 MG tablet Take 1 tablet po 2x a day prn at 1st sign of cold sores     No current facility-administered medications for this visit.   Facility-Administered Medications Ordered in Other Visits  Medication Dose Route Frequency Provider Last Rate Last Admin   Sonafine emulsion 1 application  1 application  Topical Once Gery Pray, MD        PHYSICAL EXAMINATION: ECOG PERFORMANCE STATUS: 1 - Symptomatic but completely ambulatory  Vitals:   07/27/21 1024  BP: 115/69  Pulse: 88  Resp: 18  Temp: (!) 97.5 F (36.4 C)  SpO2: 100%   Filed Weights   07/27/21 1024  Weight: 137 lb 8 oz (62.4 kg)    BREAST: No palpable masses or nodules in either right or left breasts. No palpable axillary supraclavicular or infraclavicular adenopathy no breast tenderness or nipple discharge. (exam performed in the presence of a chaperone)  LABORATORY DATA:  I have reviewed the data as listed    Latest Ref Rng & Units 08/06/2020   12:31 PM  CMP  Glucose 70 - 99 mg/dL 163   BUN 6 - 20 mg/dL 15   Creatinine 0.44 - 1.00 mg/dL 0.74   Sodium 135 - 145 mmol/L 138   Potassium 3.5 - 5.1 mmol/L 3.5   Chloride 98 - 111 mmol/L 103   CO2 22 - 32 mmol/L 26   Calcium 8.9 - 10.3 mg/dL 9.0   Total Protein 6.5 - 8.1 g/dL 7.1   Total Bilirubin 0.3 - 1.2 mg/dL 0.8   Alkaline Phos 38 - 126 U/L 28   AST 15 - 41 U/L 15   ALT 0 - 44 U/L 10     Lab Results  Component Value Date   WBC 6.5 09/05/2020   HGB 13.9 09/05/2020   HCT 41.9 09/05/2020    MCV 91.3 09/05/2020   PLT 289 09/05/2020   NEUTROABS 3.9 08/06/2020    ASSESSMENT & PLAN:  Malignant neoplasm of upper-outer quadrant of left breast in female, estrogen receptor positive ([place]) 09/09/2020: Right lumpectomy: PASH, Right lumpectomy 2.: PASH Left lumpectomy: Grade 2 IDC 2.9 cm, intermediate grade DCIS, margins negative, 0/3 lymph nodes negative, ER 95%, PR 95%, HER2 equal vocal FISH negative, Ki-67 15% Oncotype DX: Score 15, distant recurrence at 9 years: 4%  Treatment plan: 1.  Adjuvant radiation 10/15/2020-11/11/2020 2. followed by adjuvant antiestrogen therapy with tamoxifen 20 mg daily x10 years   Anxiety:  She was able to discontinue Effexor (she thinks that it is caused her weight gain of 10 to 15 pounds)   Tamoxifen toxicities:    1.  Mild hot flashes: Not bothering her  Breast cancer surveillance: 1.  Breast exam 07/27/2021: Benign 2. mammogram 04/09/2021: Benign breast density category D Breast MRIs will be  obtained in September   For surveillance she will get mammograms alternating with breast MRIs. Return to clinic in 1 year for follow-up    No orders of the defined types were placed in this encounter.  The patient has a good understanding of the overall plan. she agrees with it. she will call with any problems that may develop before the next visit here. Total time spent: 30 mins including face to face time and time spent for planning, charting and co-ordination of care   Harriette Ohara, MD 07/27/21    I Gardiner Coins am scribing for Dr. Lindi Adie  I have reviewed the above documentation for accuracy and completeness, and I agree with the above.

## 2021-07-27 ENCOUNTER — Inpatient Hospital Stay: Payer: BC Managed Care – PPO | Attending: Hematology and Oncology | Admitting: Hematology and Oncology

## 2021-07-27 ENCOUNTER — Other Ambulatory Visit: Payer: Self-pay

## 2021-07-27 DIAGNOSIS — C50412 Malignant neoplasm of upper-outer quadrant of left female breast: Secondary | ICD-10-CM | POA: Diagnosis not present

## 2021-07-27 DIAGNOSIS — Z923 Personal history of irradiation: Secondary | ICD-10-CM | POA: Diagnosis not present

## 2021-07-27 DIAGNOSIS — Z79810 Long term (current) use of selective estrogen receptor modulators (SERMs): Secondary | ICD-10-CM | POA: Insufficient documentation

## 2021-07-27 DIAGNOSIS — Z170 Estrogen receptor positive status [ER+]: Secondary | ICD-10-CM | POA: Insufficient documentation

## 2021-07-27 NOTE — Assessment & Plan Note (Addendum)
09/09/2020: Right lumpectomy: PASH,Right lumpectomy 2.: PASH Left lumpectomy: Grade 2 IDC 2.9 cm, intermediate grade DCIS, margins negative, 0/3 lymph nodes negative, ER 95%, PR 95%, HER2 equal vocal FISH negative, Ki-67 15% Oncotype DX: Score 15, distant recurrence at 9 years: 4%  Treatment plan: 1.Adjuvant radiation 10/15/2020-11/11/2020 2.followed by adjuvant antiestrogen therapy with tamoxifen 20 mg daily x10 years  Anxiety:  She was able to discontinue Effexor (she thinks that it is caused her weight gain of 10 to 15 pounds)  Tamoxifen toxicities:    1.  Mild hot flashes: Not bothering her  Breast cancer surveillance: 1.  Breast exam 07/27/2021: Benign 2. mammogram 04/09/2021: Benign breast density category D Breast MRIs will be obtained in September  For surveillance she will get mammograms alternating with breast MRIs. Return to clinic in 1 year for follow-up 

## 2021-07-28 ENCOUNTER — Telehealth: Payer: Self-pay | Admitting: Hematology and Oncology

## 2021-07-28 NOTE — Telephone Encounter (Signed)
Scheduled appointment per 6/19 los. Left message.

## 2021-08-03 ENCOUNTER — Ambulatory Visit: Payer: BC Managed Care – PPO | Attending: General Surgery

## 2021-08-03 VITALS — Wt 138.0 lb

## 2021-08-03 DIAGNOSIS — Z483 Aftercare following surgery for neoplasm: Secondary | ICD-10-CM | POA: Insufficient documentation

## 2021-08-03 NOTE — Therapy (Addendum)
  OUTPATIENT PHYSICAL THERAPY SOZO SCREENING NOTE   Patient Name: Rachael Garza MRN: [id number] DOB:April 22, 1975, 46 y.o., female Today's Date: 08/03/2021  PCP: Creola Corn, MD REFERRING PROVIDER: Creola Corn, MD   PT End of Session - 08/03/21 1641     Visit Number 1   # unchanged due to screen only   PT Start Time 1638    PT Stop Time 1650    PT Time Calculation (min) 12 min    Activity Tolerance Patient tolerated treatment well    Behavior During Therapy [facility] for tasks assessed/performed             Past Medical History:  Diagnosis Date   Anxiety    Cancer (HCC)    left   Encounter for insertion of mirena IUD    Family history of breast cancer 08/07/2020   Family history of colon cancer 08/07/2020   Family history of melanoma 08/07/2020   History of acne    History of radiation therapy    Left breast 10/14/20-11/12/20 Dr. Antony Blackbird   Migraines    Situational stress    Past Surgical History:  Procedure Laterality Date   ? compression fracture L4 in college due to fall     BREAST EXCISIONAL BIOPSY Right 09/09/2020   PASH   BREAST LUMPECTOMY Left 09/09/2020   BREAST LUMPECTOMY WITH RADIOACTIVE SEED LOCALIZATION Bilateral 09/09/2020   Procedure: LEFT BREAST RADIOACTIVE SEED LOCALIZATION LUMPECTOMY WITH SENITNEL LYMPH NODE, AND RIGHT RADIOACTIVE SEED LOCALIZATION LUMPECTOMY x2;  Surgeon: Griselda Miner, MD;  Location: [facility] OR;  Service: General;  Laterality: Bilateral;   TONSILLECTOMY AND ADENOIDECTOMY     Patient Active Problem List   Diagnosis Date Noted   Genetic testing 08/14/2020   Family history of breast cancer 08/07/2020   Family history of colon cancer 08/07/2020   Family history of melanoma 08/07/2020   Malignant neoplasm of upper-outer quadrant of left breast in female, estrogen receptor positive (HCC) 08/01/2020   Migraine without aura and without status migrainosus, not intractable 03/02/2020   Cervicalgia 03/02/2020   Bilateral occipital neuralgia  03/02/2020    REFERRING DIAG: right breast cancer at risk for lymphedema  THERAPY DIAG:  Aftercare following surgery for neoplasm  PERTINENT HISTORY: Rt lumpectomy x 2 due to PASH, Lt lumpectomy wih 0/3 nodes positive.  Currently in radiation and will then have antiestrogens x 10 years   PRECAUTIONS: right UE Lymphedema risk, None  SUBJECTIVE: Pt returns for her 3 month L-Dex screen. "My ring has been feeling tight some mornings, I think my arm is up."  PAIN:  Are you having pain? No SOZO SCREENING: Patient was assessed today using the SOZO machine to determine the lymphedema index score. This was compared to her baseline score. It was determined that she is NOT within the recommended range when compared to her baseline and so she was fitted for a compression garment (gauntlet due to reporting some distal swelling, she already has a sleeve) while in the clinic today. It is recommended she return in 1 month to be reassessed. If she continues to measure outside the recommended range, physical therapy treatment will be recommended at that time and a referral requested.    Hermenia Bers, PTA 08/03/2021, 4:50 PM

## 2021-09-02 ENCOUNTER — Ambulatory Visit: Payer: BC Managed Care – PPO | Attending: General Surgery | Admitting: Rehabilitation

## 2021-09-02 ENCOUNTER — Encounter: Payer: Self-pay | Admitting: Rehabilitation

## 2021-09-02 DIAGNOSIS — Z483 Aftercare following surgery for neoplasm: Secondary | ICD-10-CM | POA: Insufficient documentation

## 2021-09-02 DIAGNOSIS — Z170 Estrogen receptor positive status [ER+]: Secondary | ICD-10-CM | POA: Insufficient documentation

## 2021-09-02 DIAGNOSIS — C50412 Malignant neoplasm of upper-outer quadrant of left female breast: Secondary | ICD-10-CM | POA: Insufficient documentation

## 2021-09-02 NOTE — Therapy (Signed)
  OUTPATIENT PHYSICAL THERAPY SOZO SCREENING NOTE   Patient Name: Rachael Garza MRN: [id number] DOB:08-13-1975, 46 y.o., female Today's Date: 09/02/2021  PCP: Shon Baton, MD REFERRING PROVIDER: Jovita Kussmaul, MD   PT End of Session - 09/02/21 1205     PT Start Time 1204    PT Stop Time 1214    PT Time Calculation (min) 10 min    Activity Tolerance Patient tolerated treatment well    Behavior During Therapy [facility] for tasks assessed/performed             Past Medical History:  Diagnosis Date   Anxiety    Cancer ([place])    left   Encounter for insertion of mirena IUD    Family history of breast cancer 08/07/2020   Family history of colon cancer 08/07/2020   Family history of melanoma 08/07/2020   History of acne    History of radiation therapy    Left breast 10/14/20-11/12/20 Dr. Gery Pray   Migraines    Situational stress    Past Surgical History:  Procedure Laterality Date   ? compression fracture L4 in college due to fall     BREAST EXCISIONAL BIOPSY Right 09/09/2020   [place]   BREAST LUMPECTOMY Left 09/09/2020   BREAST LUMPECTOMY WITH RADIOACTIVE SEED LOCALIZATION Bilateral 09/09/2020   Procedure: LEFT BREAST RADIOACTIVE SEED LOCALIZATION LUMPECTOMY WITH SENITNEL LYMPH NODE, AND RIGHT RADIOACTIVE SEED LOCALIZATION LUMPECTOMY x2;  Surgeon: Jovita Kussmaul, MD;  Location: [facility];  Service: General;  Laterality: Bilateral;   TONSILLECTOMY AND ADENOIDECTOMY     Patient Active Problem List   Diagnosis Date Noted   Genetic testing 08/14/2020   Family history of breast cancer 08/07/2020   Family history of colon cancer 08/07/2020   Family history of melanoma 08/07/2020   Malignant neoplasm of upper-outer quadrant of left breast in female, estrogen receptor positive ([place]) 08/01/2020   Migraine without aura and without status migrainosus, not intractable 03/02/2020   Cervicalgia 03/02/2020   Bilateral occipital neuralgia 03/02/2020    REFERRING DIAG: right breast  cancer at risk for lymphedema  THERAPY DIAG:  Aftercare following surgery for neoplasm  Malignant neoplasm of upper-outer quadrant of left breast in female, estrogen receptor positive ([place])  PERTINENT HISTORY: Rt lumpectomy x 2 due to [place], Lt lumpectomy wih 0/3 nodes positive.  Currently in radiation and will then have antiestrogens x 10 years   PRECAUTIONS: right UE Lymphedema risk, None  SUBJECTIVE: Pt returns for her 3 month L-Dex screen. "My ring has been feeling tight some mornings, I think my arm is up."  PAIN:  Are you having pain? No SOZO SCREENING: Patient was assessed today using the SOZO machine to determine the lymphedema index score. This was compared to her baseline score. It was determined that she is again within the recommended range when compared to her baseline. Pt reports wearing the sleeve around 75% of the time and is now back in the green zone.  Pt was instructed that her UE seems to do better wearing the compression and that if she uses it at least like last time she will maintain.  We will see back in 6 weeks.    Stark Bray, PT 09/02/2021, 12:40 PM

## 2021-10-09 ENCOUNTER — Ambulatory Visit
Admission: RE | Admit: 2021-10-09 | Discharge: 2021-10-09 | Disposition: A | Discharge status: Home / Self Care | Payer: BC Managed Care – PPO | Source: Ambulatory Visit | Attending: Adult Health | Admitting: Adult Health

## 2021-10-09 DIAGNOSIS — Z170 Estrogen receptor positive status [ER+]: Secondary | ICD-10-CM

## 2021-10-09 DIAGNOSIS — Z853 Personal history of malignant neoplasm of breast: Secondary | ICD-10-CM | POA: Diagnosis not present

## 2021-10-09 DIAGNOSIS — Z1239 Encounter for other screening for malignant neoplasm of breast: Secondary | ICD-10-CM | POA: Diagnosis not present

## 2021-10-09 MED ORDER — GADOBUTROL 1 MMOL/ML IV SOLN
6.0000 mL | Freq: Once | INTRAVENOUS | Status: AC | PRN
  Administered 2021-10-09: 6 mL via INTRAVENOUS

## 2021-10-13 ENCOUNTER — Encounter: Payer: Self-pay | Admitting: Adult Health

## 2021-10-13 ENCOUNTER — Telehealth: Payer: Self-pay

## 2021-10-13 NOTE — Telephone Encounter (Signed)
Called pt per NP to make her aware of results. Left detailed message on her VM making her aware of results and plan for diagnostic MM in march 2024. Advised pt in VM to call us with any questions.

## 2021-10-13 NOTE — Telephone Encounter (Signed)
-----   Message from Gardenia Phlegm, NP sent at 10/10/2021 12:58 PM EDT ----- Negative screening MRI, please notify patient.   ----- Message ----- From: Interface, Rad Results In Sent: 10/09/2021   2:14 PM EDT To: Gardenia Phlegm, NP

## 2021-10-14 ENCOUNTER — Ambulatory Visit: Payer: Self-pay | Admitting: Rehabilitation

## 2021-10-20 DIAGNOSIS — Z6822 Body mass index (BMI) 22.0-22.9, adult: Secondary | ICD-10-CM | POA: Diagnosis not present

## 2021-10-20 DIAGNOSIS — R300 Dysuria: Secondary | ICD-10-CM | POA: Diagnosis not present

## 2021-10-20 DIAGNOSIS — R3915 Urgency of urination: Secondary | ICD-10-CM | POA: Diagnosis not present

## 2021-10-20 DIAGNOSIS — N930 Postcoital and contact bleeding: Secondary | ICD-10-CM | POA: Diagnosis not present

## 2021-10-20 IMAGING — MG MM PLC BREAST LOC DEV 1ST LESION INC MAMMO GUIDE*L*
7 series · 7 of 7 positions shown · non-contrast
Comparison: Previous exam(s).

CLINICAL DATA: 44-year-old female for radioactive seed localization
of LEFT breast cancer prior to lumpectomy and radioactive seed
localizations of 2 areas of biopsy-proven PASH/MR enhancement within
the RIGHT breast prior to RIGHT breast excisions.

EXAM:
MAMMOGRAPHIC GUIDED RADIOACTIVE SEED LOCALIZATION OF THE LEFT BREAST
MAMMOGRAPHIC GUIDED RADIOACTIVE SEED LOCALIZATION OF THE RIGHT
BREAST X 2

[L CC (1 of 3)]
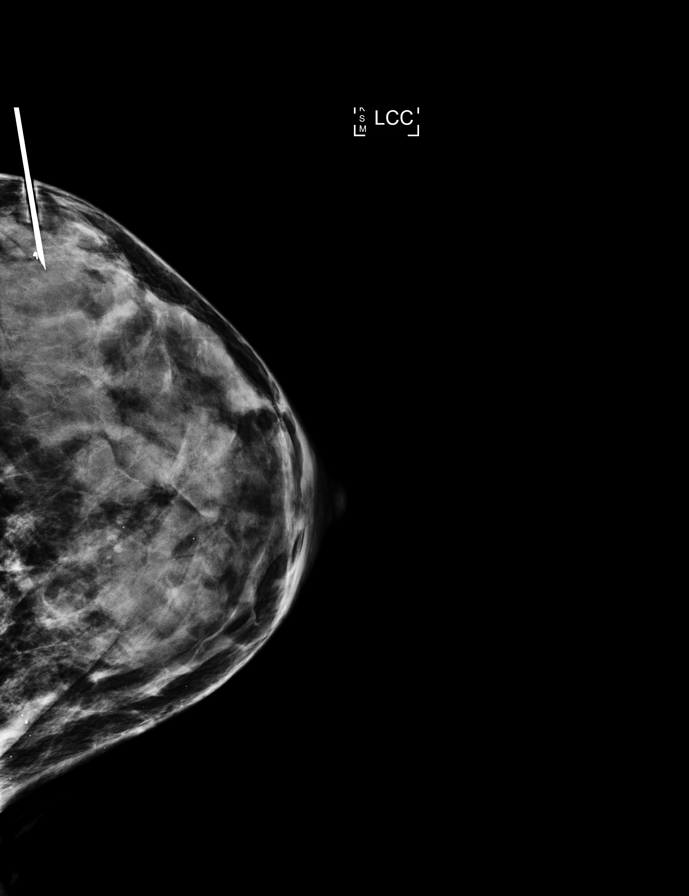

[L LM (1 of 4)]
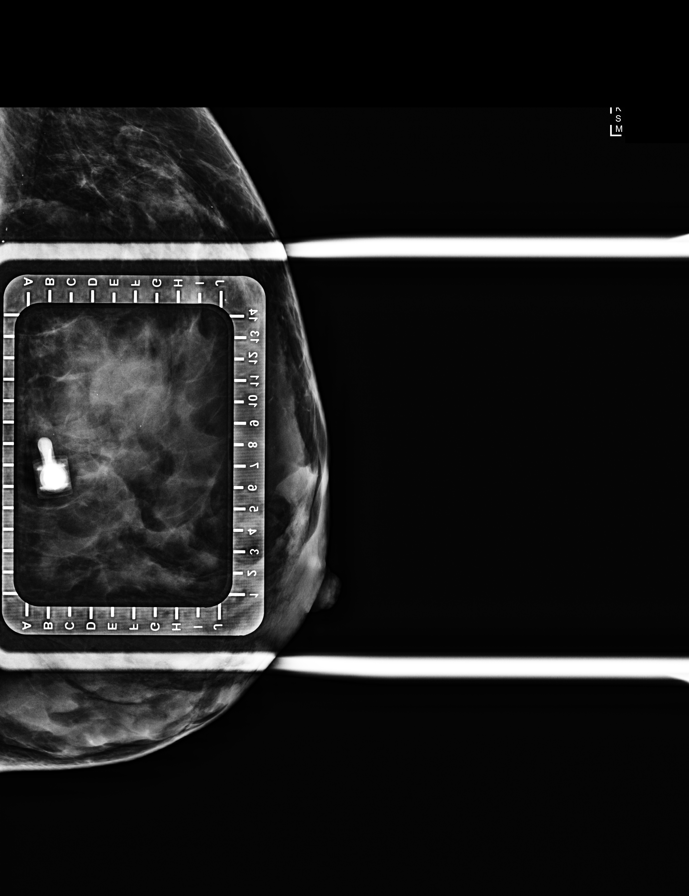

[L LM (2 of 4)]
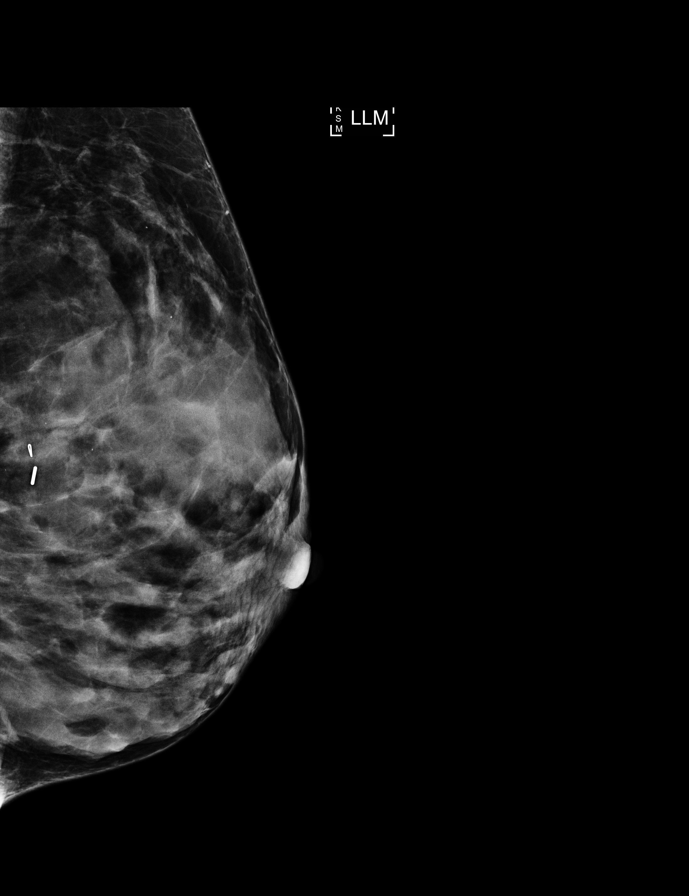

[L LM (3 of 4)]
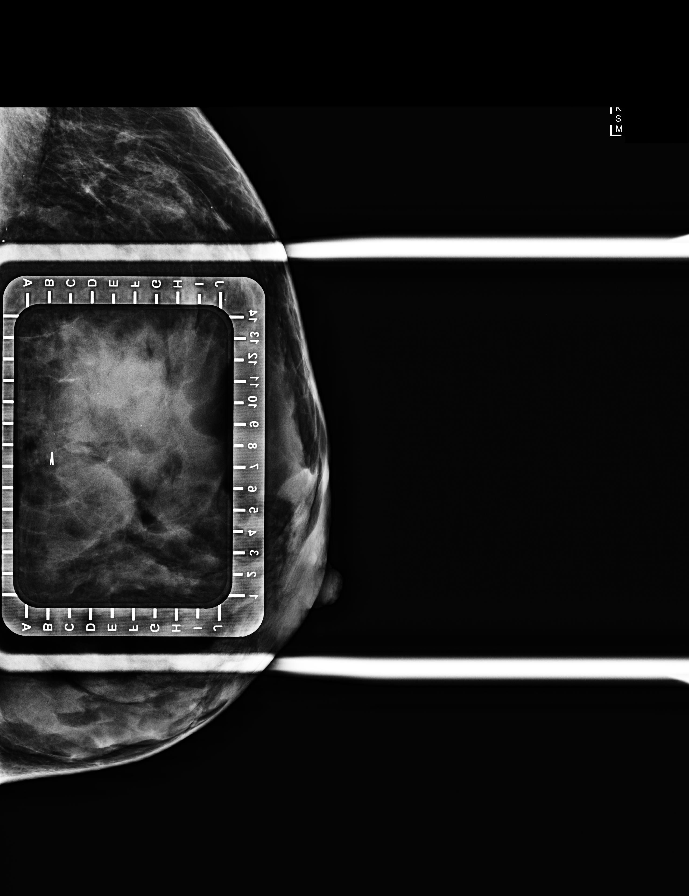

[L CC (2 of 3)]
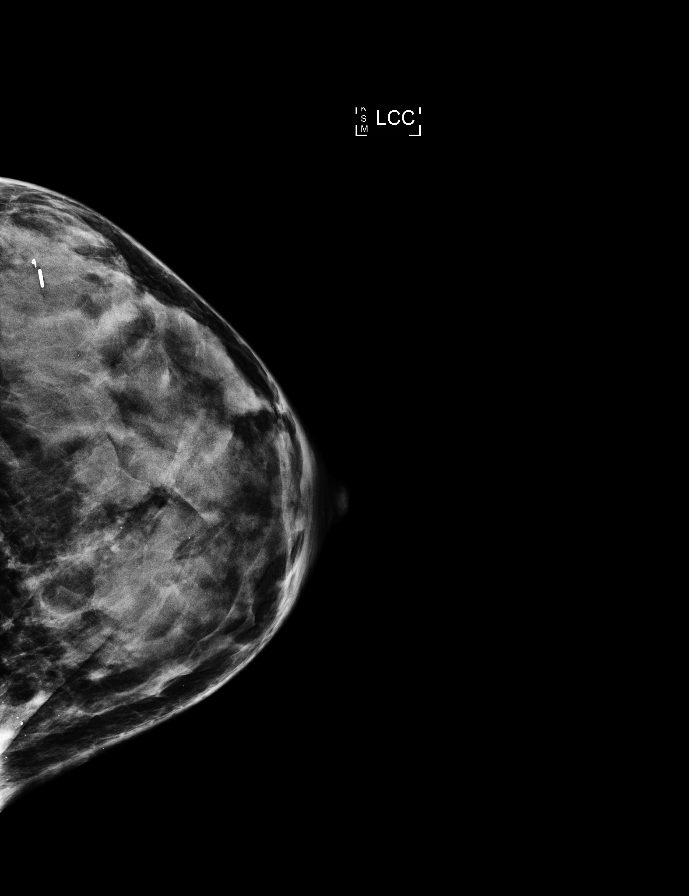

[L LM (4 of 4)]
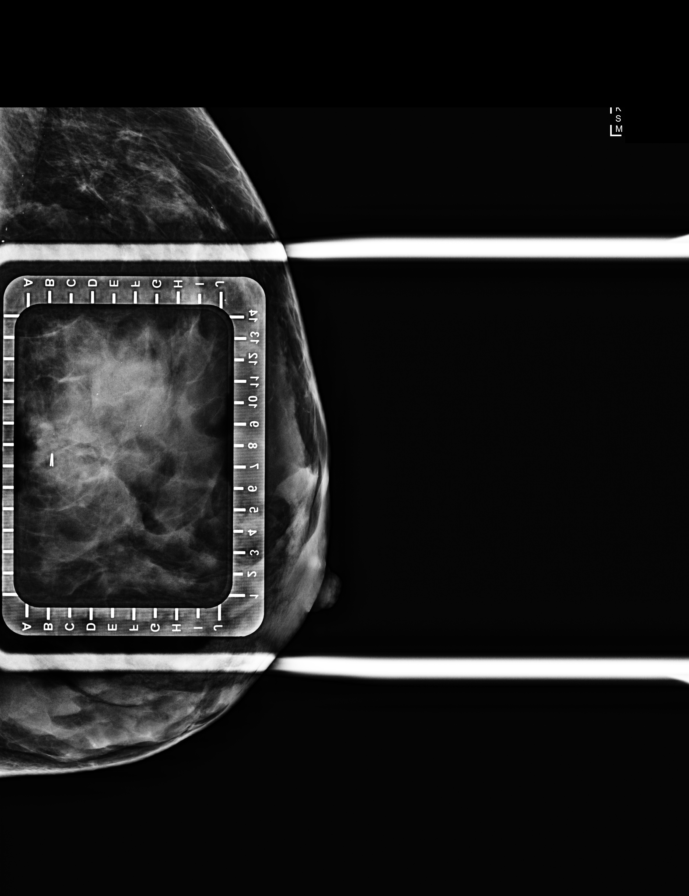

[L CC (3 of 3)]
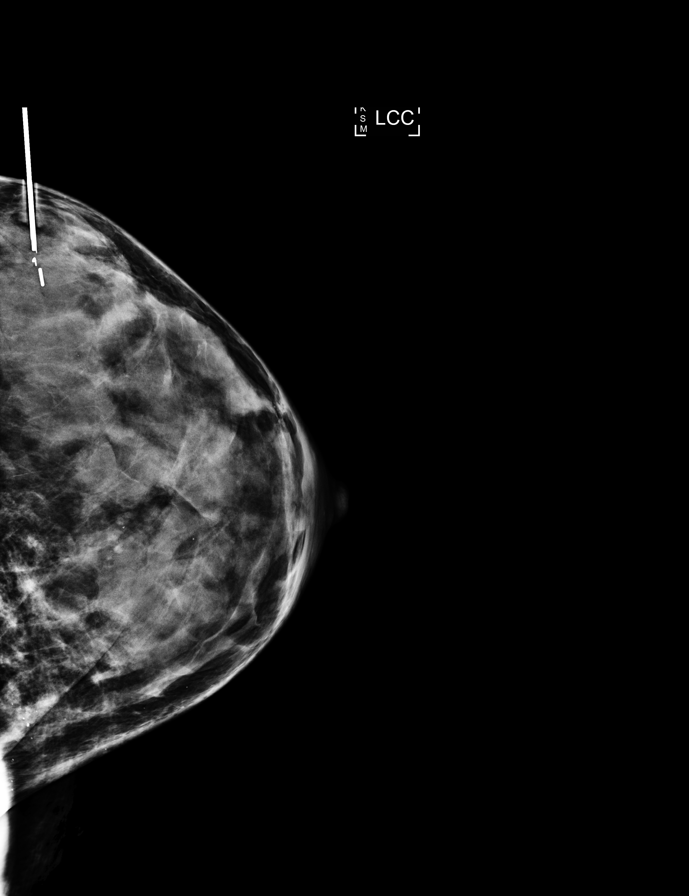

[7 of 7 positions shown; findings below may reference images not displayed]

FINDINGS: Patient presents for radioactive seed localization prior to LEFT
lumpectomy and 2 RIGHT breast surgical excisions. I met with the
patient and we discussed the procedures of seed localization
including benefits and alternatives. We discussed the high
likelihood of successful procedures. We discussed the risks of the
procedures including infection, bleeding, tissue injury and further
surgery. We discussed the low dose of radioactivity involved in the
procedures. Informed, written consent was given.

The usual time-out protocol was performed immediately prior to the
procedures.

MAMMOGRAPHIC GUIDED RADIOACTIVE SEED LOCALIZATION OF THE LEFT BREAST
(carcinoma-RIBBON clip):

Using mammographic guidance, sterile technique, 1% lidocaine and an
D-SLA radioactive seed, the RIBBON clip was localized using a
LATERAL approach. The follow-up mammogram images confirm the seed in
the expected location and were marked for Dr. Worku.

Follow-up survey of the patient confirms presence of the radioactive
seed.

Order number of D-SLA seed:  [id number].

Total activity:  0.247 millicuries.  Reference Date: 09/08/2020.

MAMMOGRAPHIC GUIDED RADIOACTIVE SEED LOCALIZATION OF THE RIGHT
BREAST #1 (CYLINDER clip):

Using mammographic guidance, sterile technique, 1% lidocaine and an
D-SLA radioactive seed, the CYLINDER clip was localized using a
MEDIAL approach. The follow-up mammogram images confirm the seed in
the expected location and were marked for Dr. Worku.

Follow-up survey of the patient confirms presence of the radioactive
seed.

Order number of D-SLA seed:  [id number].

Total activity:  0.263 millicuries.  Reference Date: 08/27/2020.

MAMMOGRAPHIC GUIDED RADIOACTIVE SEED LOCALIZATION OF THE RIGHT
BREAST #2 (BARBELL clip):

Using mammographic guidance, sterile technique, 1% lidocaine and an
D-SLA radioactive seed, the BARBELL clip was localized using a
MEDIAL approach. The follow-up mammogram images confirm the seed in
the expected location and were marked for Dr. Worku.

Follow-up survey of the patient confirms presence of the radioactive
seed.

Order number of D-SLA seed:  [id number].

Total activity:  0.263 millicuries.  Reference Date: 08/27/2020.

The patient tolerated the procedures well and was released from the
[REDACTED]. She was given instructions regarding seed removal.
IMPRESSION: Radioactive seed localization of the LEFT breast.

Two radioactive seed localizations of the RIGHT breast.

No apparent complications.

## 2021-10-21 ENCOUNTER — Ambulatory Visit: Payer: BC Managed Care – PPO | Attending: General Surgery | Admitting: Rehabilitation

## 2021-10-21 DIAGNOSIS — Z483 Aftercare following surgery for neoplasm: Secondary | ICD-10-CM | POA: Insufficient documentation

## 2021-10-21 NOTE — Therapy (Signed)
  OUTPATIENT PHYSICAL THERAPY SOZO SCREENING NOTE   Patient Name: Rachael Garza MRN: [id number] DOB:1975-10-06, 46 y.o., female Today's Date: 10/21/2021  PCP: Shon Baton, MD REFERRING PROVIDER: Jovita Kussmaul, MD   PT End of Session - 10/21/21 1251     Visit Number 1   screen only   PT Start Time 1252    PT Stop Time 1300    PT Time Calculation (min) 8 min    Activity Tolerance Patient tolerated treatment well    Behavior During Therapy [facility] for tasks assessed/performed             Past Medical History:  Diagnosis Date   Anxiety    Cancer ([place])    left   Encounter for insertion of mirena IUD    Family history of breast cancer 08/07/2020   Family history of colon cancer 08/07/2020   Family history of melanoma 08/07/2020   History of acne    History of radiation therapy    Left breast 10/14/20-11/12/20 Dr. Gery Pray   Migraines    Situational stress    Past Surgical History:  Procedure Laterality Date   ? compression fracture L4 in college due to fall     BREAST EXCISIONAL BIOPSY Right 09/09/2020   [place]   BREAST LUMPECTOMY Left 09/09/2020   BREAST LUMPECTOMY WITH RADIOACTIVE SEED LOCALIZATION Bilateral 09/09/2020   Procedure: LEFT BREAST RADIOACTIVE SEED LOCALIZATION LUMPECTOMY WITH SENITNEL LYMPH NODE, AND RIGHT RADIOACTIVE SEED LOCALIZATION LUMPECTOMY x2;  Surgeon: Jovita Kussmaul, MD;  Location: [place] [facility];  Service: General;  Laterality: Bilateral;   TONSILLECTOMY AND ADENOIDECTOMY     Patient Active Problem List   Diagnosis Date Noted   Genetic testing 08/14/2020   Family history of breast cancer 08/07/2020   Family history of colon cancer 08/07/2020   Family history of melanoma 08/07/2020   Malignant neoplasm of upper-outer quadrant of left breast in female, estrogen receptor positive ([place]) 08/01/2020   Migraine without aura and without status migrainosus, not intractable 03/02/2020   Cervicalgia 03/02/2020   Bilateral occipital neuralgia 03/02/2020     REFERRING DIAG: right breast cancer at risk for lymphedema  THERAPY DIAG:  Aftercare following surgery for neoplasm  PERTINENT HISTORY: Rt lumpectomy x 2 due to [place], Lt lumpectomy wih 0/3 nodes positive.  Currently in radiation and will then have antiestrogens x 10 years   PRECAUTIONS: right UE Lymphedema risk, None  SUBJECTIVE: Pt returns for her 3 month L-Dex screen.   PAIN:  Are you having pain? No SOZO SCREENING: Patient was assessed today using the SOZO machine to determine the lymphedema index score. This was compared to her baseline score. It was determined that she is again within the recommended range when compared to her baseline. Pt reports wearing the sleeve around 75% of the time and is still back in the green zone.  Pt was instructed that her UE seems to do better wearing the compression and that if she uses it at least like last time she will maintain.  We will resume 3 months.     Stark Bray, PT 10/21/2021, 1:57 PM

## 2022-01-25 ENCOUNTER — Ambulatory Visit: Payer: BC Managed Care – PPO | Attending: General Surgery

## 2022-01-25 VITALS — Wt 141.5 lb

## 2022-01-25 DIAGNOSIS — Z483 Aftercare following surgery for neoplasm: Secondary | ICD-10-CM | POA: Insufficient documentation

## 2022-01-25 NOTE — Therapy (Addendum)
  OUTPATIENT PHYSICAL THERAPY SOZO SCREENING NOTE   Patient Name: Rachael Garza MRN: [id number] DOB:1975-11-02, 46 y.o., female Today's Date: 01/25/2022  PCP: Shon Baton, MD REFERRING PROVIDER: Jovita Kussmaul, MD   PT End of Session - 01/25/22 1530     Visit Number 1   # unchanged due to screen only   PT Start Time 1528    PT Stop Time 1532    PT Time Calculation (min) 4 min    Activity Tolerance Patient tolerated treatment well    Behavior During Therapy [facility] for tasks assessed/performed             Past Medical History:  Diagnosis Date   Anxiety    Cancer ([place])    left   Encounter for insertion of mirena IUD    Family history of breast cancer 08/07/2020   Family history of colon cancer 08/07/2020   Family history of melanoma 08/07/2020   History of acne    History of radiation therapy    Left breast 10/14/20-11/12/20 Dr. Gery Pray   Migraines    Situational stress    Past Surgical History:  Procedure Laterality Date   ? compression fracture L4 in college due to fall     BREAST EXCISIONAL BIOPSY Right 09/09/2020   [place]   BREAST LUMPECTOMY Left 09/09/2020   BREAST LUMPECTOMY WITH RADIOACTIVE SEED LOCALIZATION Bilateral 09/09/2020   Procedure: LEFT BREAST RADIOACTIVE SEED LOCALIZATION LUMPECTOMY WITH SENITNEL LYMPH NODE, AND RIGHT RADIOACTIVE SEED LOCALIZATION LUMPECTOMY x2;  Surgeon: Jovita Kussmaul, MD;  Location: [place];  Service: General;  Laterality: Bilateral;   TONSILLECTOMY AND ADENOIDECTOMY     Patient Active Problem List   Diagnosis Date Noted   Genetic testing 08/14/2020   Family history of breast cancer 08/07/2020   Family history of colon cancer 08/07/2020   Family history of melanoma 08/07/2020   Malignant neoplasm of upper-outer quadrant of left breast in female, estrogen receptor positive ([place]) 08/01/2020   Migraine without aura and without status migrainosus, not intractable 03/02/2020   Cervicalgia 03/02/2020   Bilateral occipital  neuralgia 03/02/2020    REFERRING DIAG: right breast cancer at risk for lymphedema  THERAPY DIAG:  Aftercare following surgery for neoplasm  PERTINENT HISTORY: Rt lumpectomy x 2 due to [place], Lt lumpectomy wih 0/3 nodes positive.  Currently in radiation and will then have antiestrogens x 10 years   PRECAUTIONS: right UE Lymphedema risk, None  SUBJECTIVE: Pt returns for her 3 month L-Dex screen.   PAIN:  Are you having pain? No SOZO SCREENING:  Patient was assessed today using the SOZO machine to determine the lymphedema index score. This was compared to her baseline score. It was determined that she is within the recommended range when compared to her baseline and no further action is needed at this time. She will continue SOZO screenings. These are done every 3 months for 2 years post operatively followed by every 6 months for 2 years, and then annually   Otelia Limes, PTA 01/25/2022, 3:32 PM

## 2022-02-08 ENCOUNTER — Encounter: Payer: Self-pay | Admitting: Hematology and Oncology

## 2022-02-09 ENCOUNTER — Other Ambulatory Visit: Payer: Self-pay

## 2022-02-09 MED ORDER — TAMOXIFEN CITRATE 20 MG PO TABS: 20.0000 mg | ORAL_TABLET | Freq: Every day | ORAL | 3 refills | Status: DC

## 2022-02-24 ENCOUNTER — Other Ambulatory Visit: Payer: Self-pay | Admitting: Adult Health

## 2022-02-24 DIAGNOSIS — R928 Other abnormal and inconclusive findings on diagnostic imaging of breast: Secondary | ICD-10-CM

## 2022-03-08 DIAGNOSIS — L578 Other skin changes due to chronic exposure to nonionizing radiation: Secondary | ICD-10-CM | POA: Diagnosis not present

## 2022-03-08 DIAGNOSIS — Z86018 Personal history of other benign neoplasm: Secondary | ICD-10-CM | POA: Diagnosis not present

## 2022-03-08 DIAGNOSIS — L821 Other seborrheic keratosis: Secondary | ICD-10-CM | POA: Diagnosis not present

## 2022-03-08 DIAGNOSIS — L814 Other melanin hyperpigmentation: Secondary | ICD-10-CM | POA: Diagnosis not present

## 2022-03-11 DIAGNOSIS — E7800 Pure hypercholesterolemia, unspecified: Secondary | ICD-10-CM | POA: Diagnosis not present

## 2022-03-11 DIAGNOSIS — F419 Anxiety disorder, unspecified: Secondary | ICD-10-CM | POA: Diagnosis not present

## 2022-03-18 DIAGNOSIS — Z1331 Encounter for screening for depression: Secondary | ICD-10-CM | POA: Diagnosis not present

## 2022-03-18 DIAGNOSIS — C50412 Malignant neoplasm of upper-outer quadrant of left female breast: Secondary | ICD-10-CM | POA: Diagnosis not present

## 2022-03-18 DIAGNOSIS — R82998 Other abnormal findings in urine: Secondary | ICD-10-CM | POA: Diagnosis not present

## 2022-03-18 DIAGNOSIS — F418 Other specified anxiety disorders: Secondary | ICD-10-CM | POA: Diagnosis not present

## 2022-03-18 DIAGNOSIS — Z170 Estrogen receptor positive status [ER+]: Secondary | ICD-10-CM | POA: Diagnosis not present

## 2022-03-18 DIAGNOSIS — Z0000 Encounter for general adult medical examination without abnormal findings: Secondary | ICD-10-CM | POA: Diagnosis not present

## 2022-03-18 DIAGNOSIS — Z79899 Other long term (current) drug therapy: Secondary | ICD-10-CM | POA: Diagnosis not present

## 2022-03-18 DIAGNOSIS — Z1389 Encounter for screening for other disorder: Secondary | ICD-10-CM | POA: Diagnosis not present

## 2022-04-12 ENCOUNTER — Ambulatory Visit
Admission: RE | Admit: 2022-04-12 | Discharge: 2022-04-12 | Disposition: A | Discharge status: Home / Self Care | Payer: BC Managed Care – PPO | Source: Ambulatory Visit | Attending: Adult Health | Admitting: Adult Health

## 2022-04-12 DIAGNOSIS — Z853 Personal history of malignant neoplasm of breast: Secondary | ICD-10-CM | POA: Diagnosis not present

## 2022-04-12 DIAGNOSIS — R928 Other abnormal and inconclusive findings on diagnostic imaging of breast: Secondary | ICD-10-CM

## 2022-04-12 HISTORY — DX: Personal history of irradiation: Z92.3

## 2022-04-27 DIAGNOSIS — Z01419 Encounter for gynecological examination (general) (routine) without abnormal findings: Secondary | ICD-10-CM | POA: Diagnosis not present

## 2022-04-27 DIAGNOSIS — Z6822 Body mass index (BMI) 22.0-22.9, adult: Secondary | ICD-10-CM | POA: Diagnosis not present

## 2022-05-03 ENCOUNTER — Ambulatory Visit: Payer: BC Managed Care – PPO

## 2022-05-12 ENCOUNTER — Encounter: Payer: Self-pay | Admitting: Rehabilitation

## 2022-05-12 ENCOUNTER — Ambulatory Visit: Payer: BC Managed Care – PPO | Attending: General Surgery | Admitting: Rehabilitation

## 2022-05-12 DIAGNOSIS — C50412 Malignant neoplasm of upper-outer quadrant of left female breast: Secondary | ICD-10-CM | POA: Insufficient documentation

## 2022-05-12 DIAGNOSIS — Z170 Estrogen receptor positive status [ER+]: Secondary | ICD-10-CM

## 2022-05-12 DIAGNOSIS — Z483 Aftercare following surgery for neoplasm: Secondary | ICD-10-CM | POA: Insufficient documentation

## 2022-05-12 NOTE — Therapy (Signed)
  OUTPATIENT PHYSICAL THERAPY SOZO SCREENING NOTE   Patient Name: Rachael Garza MRN: [id number] DOB:22-Jan-1976, 47 y.o., female Today's Date: 05/12/2022  PCP: Shon Baton, MD REFERRING PROVIDER: Jovita Kussmaul, MD     Past Medical History:  Diagnosis Date   Anxiety    Cancer    left   Encounter for insertion of mirena IUD    Family history of breast cancer 08/07/2020   Family history of colon cancer 08/07/2020   Family history of melanoma 08/07/2020   History of acne    History of radiation therapy    Left breast 10/14/20-11/12/20 Dr. Gery Pray   Migraines    Personal history of radiation therapy    Situational stress    Past Surgical History:  Procedure Laterality Date   ? compression fracture L4 in college due to fall     BREAST EXCISIONAL BIOPSY Right 09/09/2020   [place]   BREAST LUMPECTOMY Left 09/09/2020   BREAST LUMPECTOMY WITH RADIOACTIVE SEED LOCALIZATION Bilateral 09/09/2020   Procedure: LEFT BREAST RADIOACTIVE SEED LOCALIZATION LUMPECTOMY WITH SENITNEL LYMPH NODE, AND RIGHT RADIOACTIVE SEED LOCALIZATION LUMPECTOMY x2;  Surgeon: Jovita Kussmaul, MD;  Location: [place];  Service: General;  Laterality: Bilateral;   TONSILLECTOMY AND ADENOIDECTOMY     Patient Active Problem List   Diagnosis Date Noted   Genetic testing 08/14/2020   Family history of breast cancer 08/07/2020   Family history of colon cancer 08/07/2020   Family history of melanoma 08/07/2020   Malignant neoplasm of upper-outer quadrant of left breast in female, estrogen receptor positive 08/01/2020   Migraine without aura and without status migrainosus, not intractable 03/02/2020   Cervicalgia 03/02/2020   Bilateral occipital neuralgia 03/02/2020    REFERRING DIAG: right breast cancer at risk for lymphedema  THERAPY DIAG:  Aftercare following surgery for neoplasm  Malignant neoplasm of upper-outer quadrant of left breast in female, estrogen receptor positive  PERTINENT HISTORY: Rt lumpectomy  x 2 due to [place], Lt lumpectomy wih 0/3 nodes positive.  Currently in radiation and will then have antiestrogens x 10 years   PRECAUTIONS: right UE Lymphedema risk, None  SUBJECTIVE: Pt returns for her 3 month L-Dex screen.   PAIN:  Are you having pain? No  SOZO SCREENING:  Patient was assessed today using the SOZO machine to determine the lymphedema index score. This was compared to her baseline score. It was determined that she is NOT within the recommended range when compared to her baseline and will start to wear her sleeve again.  We will recheck in 1 month.     Stark Bray, PT 05/12/2022, 4:01 PM

## 2022-06-07 ENCOUNTER — Ambulatory Visit: Payer: BC Managed Care – PPO | Admitting: Rehabilitation

## 2022-06-10 ENCOUNTER — Ambulatory Visit: Payer: BC Managed Care – PPO | Admitting: Rehabilitation

## 2022-06-14 ENCOUNTER — Ambulatory Visit: Payer: BC Managed Care – PPO | Attending: General Surgery | Admitting: Rehabilitation

## 2022-06-14 DIAGNOSIS — C50412 Malignant neoplasm of upper-outer quadrant of left female breast: Secondary | ICD-10-CM | POA: Insufficient documentation

## 2022-06-14 DIAGNOSIS — Z170 Estrogen receptor positive status [ER+]: Secondary | ICD-10-CM

## 2022-06-14 DIAGNOSIS — Z483 Aftercare following surgery for neoplasm: Secondary | ICD-10-CM

## 2022-06-14 NOTE — Therapy (Signed)
  OUTPATIENT PHYSICAL THERAPY SOZO SCREENING NOTE   Patient Name: Rachael Garza MRN: [id number] DOB:March 18, 1975, 47 y.o., female Today's Date: 06/14/2022  PCP: Creola Corn, MD REFERRING PROVIDER: Chevis Pretty III, MD   PT End of Session - 06/14/22 1300     Visit Number 1   screen only   PT Start Time 1250    PT Stop Time 1300    PT Time Calculation (min) 10 min    Activity Tolerance Patient tolerated treatment well    Behavior During Therapy [facility] for tasks assessed/performed              Past Medical History:  Diagnosis Date   Anxiety    Cancer ([facility])    left   Encounter for insertion of mirena IUD    Family history of breast cancer 08/07/2020   Family history of colon cancer 08/07/2020   Family history of melanoma 08/07/2020   History of acne    History of radiation therapy    Left breast 10/14/20-11/12/20 Dr. Antony Blackbird   Migraines    Personal history of radiation therapy    Situational stress    Past Surgical History:  Procedure Laterality Date   ? compression fracture L4 in college due to fall     BREAST EXCISIONAL BIOPSY Right 09/09/2020   [place]   BREAST LUMPECTOMY Left 09/09/2020   BREAST LUMPECTOMY WITH RADIOACTIVE SEED LOCALIZATION Bilateral 09/09/2020   Procedure: LEFT BREAST RADIOACTIVE SEED LOCALIZATION LUMPECTOMY WITH SENITNEL LYMPH NODE, AND RIGHT RADIOACTIVE SEED LOCALIZATION LUMPECTOMY x2;  Surgeon: Griselda Miner, MD;  Location: [facility];  Service: General;  Laterality: Bilateral;   TONSILLECTOMY AND ADENOIDECTOMY     Patient Active Problem List   Diagnosis Date Noted   Genetic testing 08/14/2020   Family history of breast cancer 08/07/2020   Family history of colon cancer 08/07/2020   Family history of melanoma 08/07/2020   Malignant neoplasm of upper-outer quadrant of left breast in female, estrogen receptor positive (HCC) 08/01/2020   Migraine without aura and without status migrainosus, not intractable 03/02/2020   Cervicalgia 03/02/2020    Bilateral occipital neuralgia 03/02/2020    REFERRING DIAG: right breast cancer at risk for lymphedema  THERAPY DIAG:  Aftercare following surgery for neoplasm  Malignant neoplasm of upper-outer quadrant of left breast in female, estrogen receptor positive (HCC)  PERTINENT HISTORY: Rt lumpectomy x 2 due to PASH, Lt lumpectomy wih 0/3 nodes positive.  Currently in radiation and will then have antiestrogens x 10 years   PRECAUTIONS: right UE Lymphedema risk, None  SUBJECTIVE: Pt returns for her 3 month L-Dex screen.   PAIN:  Are you having pain? No  SOZO SCREENING:  Patient was assessed today using the SOZO machine to determine the lymphedema index score. This was compared to her baseline score. It was determined that she is within the recommended range when compared to her baseline and will start to wear her sleeve with all exercise and with high levels of upper body activity.  We will recheck in 6weeks.  If she has gone back into the yellow we will recommend sleeve wear and if it stays green she can use it intermittently.      Idamae Lusher, PT 06/14/2022, 1:01 PM

## 2022-07-21 NOTE — Progress Notes (Signed)
Patient Care Team: Creola Corn, MD as PCP - General (Internal Medicine) Antony Blackbird, MD as Consulting Physician (Radiation Oncology) Serena Croissant, MD as Consulting Physician (Hematology and Oncology) Griselda Miner, MD as Consulting Physician (General Surgery)  DIAGNOSIS:  Encounter Diagnosis  Name Primary?   Malignant neoplasm of upper-outer quadrant of left breast in female, estrogen receptor positive (HCC) Yes    SUMMARY OF ONCOLOGIC HISTORY: Oncology History  Malignant neoplasm of upper-outer quadrant of left breast in female, estrogen receptor positive (HCC)  07/29/2020 Initial Diagnosis    Palpable left breast mass (very dense breasts), Mammogram revealed 2.9 cm masss, MRI 3.2 cm mass: Biopsy Grade 2 IDC ER 95%, PR 95%, Her 2 Neg, Ki 67 15% MRI on Right breast: 3.7 cm NME LIQ and 1.2 cm NME UIQ: Needs biopsies   08/06/2020 Cancer Staging   Staging form: Breast, AJCC 8th Edition - Clinical stage from 08/06/2020: Stage IB (cT2, cN0, cM0, G2, ER+, PR+, HER2-) - Signed by Serena Croissant, MD on 08/06/2020 Stage prefix: Initial diagnosis Histologic grading system: 3 grade system Laterality: Left Staged by: Pathologist and managing physician Stage used in treatment planning: Yes National guidelines used in treatment planning: Yes Type of national guideline used in treatment planning: NCCN   08/13/2020 Genetic Testing   No pathogenic variants detected in [place] BRCAPlus Panel or CancerNext-Expanded +RNAinsight Panel.  Variant of uncertain significance detected in BLM at  p.A914T (c.2740G>A).  The report dates are August 13, 2020 and August 18, 2020, respectively.    The BRCAplus panel offered by [employer] and includes sequencing and deletion/duplication analysis for the following 8 genes: ATM, BRCA1, BRCA2, CDH1, CHEK2, PALB2, PTEN, and TP53.   The CancerNext-Expanded gene panel offered by [facility] and includes sequencing, rearrangement, and RNA analysis for the following 77  genes: AIP, ALK, APC, ATM, AXIN2, BAP1, BARD1, BLM, BMPR1A, BRCA1, BRCA2, BRIP1, CDC73, CDH1, CDK4, CDKN1B, CDKN2A, CHEK2, CTNNA1, DICER1, FANCC, FH, FLCN, GALNT12, KIF1B, LZTR1, MAX, MEN1, MET, MLH1, MSH2, MSH3, MSH6, MUTYH, NBN, NF1, NF2, NTHL1, PALB2, PHOX2B, PMS2, POT1, PRKAR1A, PTCH1, PTEN, RAD51C, RAD51D, RB1, RECQL, RET, SDHA, SDHAF2, SDHB, SDHC, SDHD, SMAD4, SMARCA4, SMARCB1, SMARCE1, STK11, SUFU, TMEM127, TP53, TSC1, TSC2, VHL and XRCC2 (sequencing and deletion/duplication); EGFR, EGLN1, HOXB13, KIT, MITF, PDGFRA, POLD1, and POLE (sequencing only); EPCAM and GREM1 (deletion/duplication only).    09/09/2020 Surgery   Right lumpectomy: PASH Right lumpectomy 2.: PASH Left lumpectomy: Grade 2 IDC 2.9 cm, intermediate grade DCIS, margins negative, 0/3 lymph nodes negative, ER 95%, PR 95%, HER2 equal vocal FISH negative, Ki-67 15%   09/09/2020 Oncotype testing   15/4%   09/22/2020 Cancer Staging   Staging form: Breast, AJCC 8th Edition - Pathologic: Stage IA (pT2, pN0, cM0, G2, ER+, PR+, HER2-, Oncotype DX score: 15) - Signed by Serena Croissant, MD on 09/22/2020 Stage prefix: Initial diagnosis Multigene prognostic tests performed: Oncotype DX Recurrence score range: Greater than or equal to 11 Histologic grading system: 3 grade system   10/15/2020 - 11/11/2020 Radiation Therapy   Adjuvant radiation   11/27/2020 -  Anti-estrogen oral therapy   Tamoxifen 20 mg daily     CHIEF COMPLIANT: Follow-up of left breast cancer on Tamoxifen    INTERVAL HISTORY: Rachael Garza is a 47 y.o. with above-mentioned history of left breast cancer. She presents to the clinic today for follow-up. She reports that she tolerating the tamoxifen. She does have some joint pain and some mild fatigue. She says the joint pain is more in the  hands and she feels exhausted more at night.  ALLERGIES:  is allergic to caffeine.  MEDICATIONS:  Current Outpatient Medications  Medication Sig Dispense Refill   levonorgestrel  (MIRENA, 52 MG,) 20 MCG/24HR IUD Mirena 20 mcg/24 hours (7 yrs) 52 mg intrauterine device  Take 1 device every day by intrauterine route as directed.     melatonin 3 MG TABS tablet Take 3 mg by mouth at bedtime as needed (sleep).     tamoxifen (NOLVADEX) 20 MG tablet Take 1 tablet (20 mg total) by mouth daily. 90 tablet 3   valACYclovir (VALTREX) 500 MG tablet Take 1 tablet po 2x a day prn at 1st sign of cold sores     No current facility-administered medications for this visit.   Facility-Administered Medications Ordered in Other Visits  Medication Dose Route Frequency Provider Last Rate Last Admin   Sonafine emulsion 1 application  1 application  Topical Once Antony Blackbird, MD        PHYSICAL EXAMINATION: ECOG PERFORMANCE STATUS: 1 - Symptomatic but completely ambulatory  Vitals:   07/29/22 1002  BP: (!) 109/57  Pulse: 73  Resp: 18  Temp: 98.1 F (36.7 C)  SpO2: 100%   Filed Weights   07/29/22 1002  Weight: 138 lb (62.6 kg)    BREAST: No palpable masses or nodules in either right or left breasts. No palpable axillary supraclavicular or infraclavicular adenopathy no breast tenderness or nipple discharge. (exam performed in the presence of a chaperone)  LABORATORY DATA:  I have reviewed the data as listed    Latest Ref Rng & Units 08/06/2020   12:31 PM  CMP  Glucose 70 - 99 mg/dL 595   BUN 6 - 20 mg/dL 15   Creatinine 6.38 - 1.00 mg/dL 7.56   Sodium 433 - 295 mmol/L 138   Potassium 3.5 - 5.1 mmol/L 3.5   Chloride 98 - 111 mmol/L 103   CO2 22 - 32 mmol/L 26   Calcium 8.9 - 10.3 mg/dL 9.0   Total Protein 6.5 - 8.1 g/dL 7.1   Total Bilirubin 0.3 - 1.2 mg/dL 0.8   Alkaline Phos 38 - 126 U/L 28   AST 15 - 41 U/L 15   ALT 0 - 44 U/L 10     Lab Results  Component Value Date   WBC 6.5 09/05/2020   HGB 13.9 09/05/2020   HCT 41.9 09/05/2020   MCV 91.3 09/05/2020   PLT 289 09/05/2020   NEUTROABS 3.9 08/06/2020    ASSESSMENT & PLAN:  Malignant neoplasm of  upper-outer quadrant of left breast in female, estrogen receptor positive (HCC) 09/09/2020: Right lumpectomy: PASH, Right lumpectomy 2.: PASH Left lumpectomy: Grade 2 IDC 2.9 cm, intermediate grade DCIS, margins negative, 0/3 lymph nodes negative, ER 95%, PR 95%, HER2 equal vocal FISH negative, Ki-67 15% Oncotype DX: Score 15, distant recurrence at 9 years: 4%   Treatment plan: 1.  Adjuvant radiation 10/15/2020-11/11/2020 2. followed by adjuvant antiestrogen therapy with tamoxifen 20 mg daily x10 years   Anxiety:  She was able to discontinue Effexor (she thinks that it is caused her weight gain of 10 to 15 pounds)   Tamoxifen toxicities:    1.  Mild hot flashes: Not bothering her 2. fatigue: Uncertain if it is related to tamoxifen.   Breast cancer surveillance: 1.  Breast exam 07/29/2022: Benign 2. mammogram 04/12/2022: Benign breast density category D 3. Breast MRIs 10/10/2022: Benign breast density category D, recommended annual breast MRIs   For surveillance  she will get mammograms alternating with breast MRIs. Return to clinic in 1 year for follow-up    Orders Placed This Encounter  Procedures   MR BREAST BILATERAL W WO CONTRAST INC CAD    Standing Status:   Future    Standing Expiration Date:   07/29/2023    Order Specific Question:   If indicated for the ordered procedure, I authorize the administration of contrast media per Radiology protocol    Answer:   Yes    Order Specific Question:   What is the patient's sedation requirement?    Answer:   No Sedation    Order Specific Question:   Does the patient have a pacemaker or implanted devices?    Answer:   No    Order Specific Question:   Preferred imaging location?    Answer:   [facility] (table limit-550lbs)   The patient has a good understanding of the overall plan. she agrees with it. she will call with any problems that may develop before the next visit here. Total time spent: 30 mins including face to face time and time  spent for planning, charting and co-ordination of care   Tamsen Meek, MD 07/29/22    I Janan Ridge am acting as a Neurosurgeon for [employer]  I have reviewed the above documentation for accuracy and completeness, and I agree with the above.

## 2022-07-26 ENCOUNTER — Ambulatory Visit: Payer: BC Managed Care – PPO | Attending: General Surgery | Admitting: Rehabilitation

## 2022-07-26 ENCOUNTER — Encounter: Payer: Self-pay | Admitting: Rehabilitation

## 2022-07-26 DIAGNOSIS — R293 Abnormal posture: Secondary | ICD-10-CM | POA: Insufficient documentation

## 2022-07-26 DIAGNOSIS — C50412 Malignant neoplasm of upper-outer quadrant of left female breast: Secondary | ICD-10-CM | POA: Insufficient documentation

## 2022-07-26 DIAGNOSIS — L599 Disorder of the skin and subcutaneous tissue related to radiation, unspecified: Secondary | ICD-10-CM | POA: Insufficient documentation

## 2022-07-26 DIAGNOSIS — Z170 Estrogen receptor positive status [ER+]: Secondary | ICD-10-CM | POA: Insufficient documentation

## 2022-07-26 DIAGNOSIS — Z483 Aftercare following surgery for neoplasm: Secondary | ICD-10-CM | POA: Insufficient documentation

## 2022-07-26 NOTE — Therapy (Signed)
  OUTPATIENT PHYSICAL THERAPY SOZO SCREENING NOTE   Patient Name: Rachael Garza MRN: [id number] DOB:November 13, 1975, 47 y.o., female Today's Date: 07/26/2022  PCP: Creola Corn, MD REFERRING PROVIDER: Griselda Miner, MD   PT End of Session - 07/26/22 1259     Visit Number 1   screen only   PT Start Time 1250    PT Stop Time 1258    PT Time Calculation (min) 8 min    Activity Tolerance Patient tolerated treatment well    Behavior During Therapy [facility] for tasks assessed/performed              Past Medical History:  Diagnosis Date   Anxiety    Cancer (HCC)    left   Encounter for insertion of mirena IUD    Family history of breast cancer 08/07/2020   Family history of colon cancer 08/07/2020   Family history of melanoma 08/07/2020   History of acne    History of radiation therapy    Left breast 10/14/20-11/12/20 Dr. Antony Blackbird   Migraines    Personal history of radiation therapy    Situational stress    Past Surgical History:  Procedure Laterality Date   ? compression fracture L4 in college due to fall     BREAST EXCISIONAL BIOPSY Right 09/09/2020   [facility]   BREAST LUMPECTOMY Left 09/09/2020   BREAST LUMPECTOMY WITH RADIOACTIVE SEED LOCALIZATION Bilateral 09/09/2020   Procedure: LEFT BREAST RADIOACTIVE SEED LOCALIZATION LUMPECTOMY WITH SENITNEL LYMPH NODE, AND RIGHT RADIOACTIVE SEED LOCALIZATION LUMPECTOMY x2;  Surgeon: Griselda Miner, MD;  Location: [facility];  Service: General;  Laterality: Bilateral;   TONSILLECTOMY AND ADENOIDECTOMY     Patient Active Problem List   Diagnosis Date Noted   Genetic testing 08/14/2020   Family history of breast cancer 08/07/2020   Family history of colon cancer 08/07/2020   Family history of melanoma 08/07/2020   Malignant neoplasm of upper-outer quadrant of left breast in female, estrogen receptor positive (HCC) 08/01/2020   Migraine without aura and without status migrainosus, not intractable 03/02/2020   Cervicalgia 03/02/2020    Bilateral occipital neuralgia 03/02/2020    REFERRING DIAG: right breast cancer at risk for lymphedema  THERAPY DIAG:  Aftercare following surgery for neoplasm  Malignant neoplasm of upper-outer quadrant of left breast in female, estrogen receptor positive (HCC)  Disorder of the skin and subcutaneous tissue related to radiation, unspecified  Abnormal posture  PERTINENT HISTORY: Rt lumpectomy x 2 due to PASH, Lt lumpectomy wih 0/3 nodes positive.  Currently in radiation and will then have antiestrogens x 10 years   PRECAUTIONS: right UE Lymphedema risk, None  SUBJECTIVE: Pt returns for her 3 month L-Dex screen.   PAIN:  Are you having pain? No  SOZO SCREENING:  Patient was assessed today using the SOZO machine to determine the lymphedema index score. This was compared to her baseline score. It was determined that she is NOT within the recommended range when compared to her baseline and will start to wear her sleeve with all exercise and with high levels of upper body activity.  We will recheck in 6weeks.  If she has gone back into the yellow we will recommend sleeve wear and if it stays green she can use it intermittently.      Idamae Lusher, PT 07/26/2022, 1:00 PM

## 2022-07-29 ENCOUNTER — Inpatient Hospital Stay: Payer: BC Managed Care – PPO | Attending: Hematology and Oncology | Admitting: Hematology and Oncology

## 2022-07-29 ENCOUNTER — Other Ambulatory Visit: Payer: Self-pay

## 2022-07-29 VITALS — BP 109/57 | HR 73 | Temp 98.1°F | Resp 18 | Ht 66.0 in | Wt 138.0 lb

## 2022-07-29 DIAGNOSIS — M2550 Pain in unspecified joint: Secondary | ICD-10-CM | POA: Diagnosis not present

## 2022-07-29 DIAGNOSIS — C50412 Malignant neoplasm of upper-outer quadrant of left female breast: Secondary | ICD-10-CM | POA: Diagnosis not present

## 2022-07-29 DIAGNOSIS — Z79810 Long term (current) use of selective estrogen receptor modulators (SERMs): Secondary | ICD-10-CM | POA: Insufficient documentation

## 2022-07-29 DIAGNOSIS — Z170 Estrogen receptor positive status [ER+]: Secondary | ICD-10-CM

## 2022-07-29 DIAGNOSIS — Z923 Personal history of irradiation: Secondary | ICD-10-CM | POA: Insufficient documentation

## 2022-07-29 DIAGNOSIS — Z793 Long term (current) use of hormonal contraceptives: Secondary | ICD-10-CM | POA: Insufficient documentation

## 2022-07-29 DIAGNOSIS — R232 Flushing: Secondary | ICD-10-CM | POA: Insufficient documentation

## 2022-07-29 DIAGNOSIS — R5383 Other fatigue: Secondary | ICD-10-CM | POA: Insufficient documentation

## 2022-07-29 NOTE — Assessment & Plan Note (Addendum)
09/09/2020: Right lumpectomy: PASH, Right lumpectomy 2.: PASH Left lumpectomy: Grade 2 IDC 2.9 cm, intermediate grade DCIS, margins negative, 0/3 lymph nodes negative, ER 95%, PR 95%, HER2 equal vocal FISH negative, Ki-67 15% Oncotype DX: Score 15, distant recurrence at 9 years: 4%   Treatment plan: 1.  Adjuvant radiation 10/15/2020-11/11/2020 2. followed by adjuvant antiestrogen therapy with tamoxifen 20 mg daily x10 years   Anxiety:  She was able to discontinue Effexor (she thinks that it is caused her weight gain of 10 to 15 pounds)   Tamoxifen toxicities:    1.  Mild hot flashes: Not bothering her 2. fatigue: Uncertain if it is related to tamoxifen.   Breast cancer surveillance: 1.  Breast exam 07/29/2022: Benign 2. mammogram 04/12/2022: Benign breast density category D 3. Breast MRIs 10/10/2022: Benign breast density category D, recommended annual breast MRIs   For surveillance she will get mammograms alternating with breast MRIs. Return to clinic in 1 year for follow-up

## 2022-09-09 ENCOUNTER — Encounter: Payer: Self-pay | Admitting: Hematology and Oncology

## 2022-09-16 ENCOUNTER — Ambulatory Visit: Payer: BC Managed Care – PPO | Admitting: Rehabilitation

## 2022-10-19 ENCOUNTER — Other Ambulatory Visit: Payer: BC Managed Care – PPO

## 2022-10-22 ENCOUNTER — Encounter: Payer: Self-pay | Admitting: Hematology and Oncology

## 2022-10-22 ENCOUNTER — Telehealth: Payer: Self-pay | Admitting: Hematology and Oncology

## 2022-10-25 ENCOUNTER — Ambulatory Visit
Admission: RE | Admit: 2022-10-25 | Discharge: 2022-10-25 | Disposition: A | Discharge status: Home / Self Care | Payer: BC Managed Care – PPO | Source: Ambulatory Visit | Attending: Hematology and Oncology | Admitting: Hematology and Oncology

## 2022-10-25 ENCOUNTER — Inpatient Hospital Stay: Payer: BC Managed Care – PPO | Admitting: Hematology and Oncology

## 2022-10-25 DIAGNOSIS — Z1239 Encounter for other screening for malignant neoplasm of breast: Secondary | ICD-10-CM | POA: Diagnosis not present

## 2022-10-25 DIAGNOSIS — Z853 Personal history of malignant neoplasm of breast: Secondary | ICD-10-CM | POA: Diagnosis not present

## 2022-10-25 DIAGNOSIS — C50412 Malignant neoplasm of upper-outer quadrant of left female breast: Secondary | ICD-10-CM

## 2022-10-25 MED ORDER — GADOPICLENOL 0.5 MMOL/ML IV SOLN
6.0000 mL | Freq: Once | INTRAVENOUS | Status: AC | PRN
  Administered 2022-10-25: 6 mL via INTRAVENOUS

## 2022-11-01 ENCOUNTER — Inpatient Hospital Stay: Payer: BC Managed Care – PPO | Attending: Hematology and Oncology | Admitting: Hematology and Oncology

## 2022-11-01 DIAGNOSIS — Z170 Estrogen receptor positive status [ER+]: Secondary | ICD-10-CM

## 2022-11-01 DIAGNOSIS — C50412 Malignant neoplasm of upper-outer quadrant of left female breast: Secondary | ICD-10-CM

## 2022-11-01 NOTE — Progress Notes (Signed)
Patient Care Team: Creola Corn, MD as PCP - General (Internal Medicine) Antony Blackbird, MD as Consulting Physician (Radiation Oncology) Serena Croissant, MD as Consulting Physician (Hematology and Oncology) Griselda Miner, MD as Consulting Physician (General Surgery)  DIAGNOSIS:  Encounter Diagnosis  Name Primary?   Malignant neoplasm of upper-outer quadrant of left breast in female, estrogen receptor positive (HCC) Yes    SUMMARY OF ONCOLOGIC HISTORY: Oncology History  Malignant neoplasm of upper-outer quadrant of left breast in female, estrogen receptor positive (HCC)  07/29/2020 Initial Diagnosis    Palpable left breast mass (very dense breasts), Mammogram revealed 2.9 cm masss, MRI 3.2 cm mass: Biopsy Grade 2 IDC ER 95%, PR 95%, Her 2 Neg, Ki 67 15% MRI on Right breast: 3.7 cm NME LIQ and 1.2 cm NME UIQ: Needs biopsies   08/06/2020 Cancer Staging   Staging form: Breast, AJCC 8th Edition - Clinical stage from 08/06/2020: Stage IB (cT2, cN0, cM0, G2, ER+, PR+, HER2-) - Signed by Serena Croissant, MD on 08/06/2020 Stage prefix: Initial diagnosis Histologic grading system: 3 grade system Laterality: Left Staged by: Pathologist and managing physician Stage used in treatment planning: Yes National guidelines used in treatment planning: Yes Type of national guideline used in treatment planning: NCCN   08/13/2020 Genetic Testing   No pathogenic variants detected in [facility] BRCAPlus Panel or CancerNext-Expanded +RNAinsight Panel.  Variant of uncertain significance detected in BLM at  p.A914T (c.2740G>A).  The report dates are August 13, 2020 and August 18, 2020, respectively.    The BRCAplus panel offered by [employer] and includes sequencing and deletion/duplication analysis for the following 8 genes: ATM, BRCA1, BRCA2, CDH1, CHEK2, PALB2, PTEN, and TP53.   The CancerNext-Expanded gene panel offered by [facility] and includes sequencing, rearrangement, and RNA analysis for the following 77  genes: AIP, ALK, APC, ATM, AXIN2, BAP1, BARD1, BLM, BMPR1A, BRCA1, BRCA2, BRIP1, CDC73, CDH1, CDK4, CDKN1B, CDKN2A, CHEK2, CTNNA1, DICER1, FANCC, FH, FLCN, GALNT12, KIF1B, LZTR1, MAX, MEN1, MET, MLH1, MSH2, MSH3, MSH6, MUTYH, NBN, NF1, NF2, NTHL1, PALB2, PHOX2B, PMS2, POT1, PRKAR1A, PTCH1, PTEN, RAD51C, RAD51D, RB1, RECQL, RET, SDHA, SDHAF2, SDHB, SDHC, SDHD, SMAD4, SMARCA4, SMARCB1, SMARCE1, STK11, SUFU, TMEM127, TP53, TSC1, TSC2, VHL and XRCC2 (sequencing and deletion/duplication); EGFR, EGLN1, HOXB13, KIT, MITF, PDGFRA, POLD1, and POLE (sequencing only); EPCAM and GREM1 (deletion/duplication only).    09/09/2020 Surgery   Right lumpectomy: PASH Right lumpectomy 2.: PASH Left lumpectomy: Grade 2 IDC 2.9 cm, intermediate grade DCIS, margins negative, 0/3 lymph nodes negative, ER 95%, PR 95%, HER2 equal vocal FISH negative, Ki-67 15%   09/09/2020 Oncotype testing   15/4%   09/22/2020 Cancer Staging   Staging form: Breast, AJCC 8th Edition - Pathologic: Stage IA (pT2, pN0, cM0, G2, ER+, PR+, HER2-, Oncotype DX score: 15) - Signed by Serena Croissant, MD on 09/22/2020 Stage prefix: Initial diagnosis Multigene prognostic tests performed: Oncotype DX Recurrence score range: Greater than or equal to 11 Histologic grading system: 3 grade system   10/15/2020 - 11/11/2020 Radiation Therapy   Adjuvant radiation   11/27/2020 -  Anti-estrogen oral therapy   Tamoxifen 20 mg daily     CHIEF COMPLIANT: Follow-up to discuss results of breast MRI  Discussed the use of AI scribe software for clinical note transcription with the patient, who gave verbal consent to proceed.  History of Present Illness   The patient, with a history of breast cancer status post surgery, is two years out from their operation. She has been undergoing regular surveillance with  mammograms and MRIs due to dense breast tissue. The most recent breast MRI on September 16th showed good results, as did the mammogram. The patient has been on  tamoxifen for two years, with side effects including fatigue and joint pain, particularly in the hands and feet. The patient also reports weight gain since starting tamoxifen, but the weight has stabilized.         ALLERGIES:  is allergic to caffeine.  MEDICATIONS:  Current Outpatient Medications  Medication Sig Dispense Refill   levonorgestrel (MIRENA, 52 MG,) 20 MCG/24HR IUD Mirena 20 mcg/24 hours (7 yrs) 52 mg intrauterine device  Take 1 device every day by intrauterine route as directed.     melatonin 3 MG TABS tablet Take 3 mg by mouth at bedtime as needed (sleep).     tamoxifen (NOLVADEX) 20 MG tablet Take 1 tablet (20 mg total) by mouth daily. 90 tablet 3   valACYclovir (VALTREX) 500 MG tablet Take 1 tablet po 2x a day prn at 1st sign of cold sores     No current facility-administered medications for this visit.   Facility-Administered Medications Ordered in Other Visits  Medication Dose Route Frequency Provider Last Rate Last Admin   Sonafine emulsion 1 application  1 application  Topical Once Antony Blackbird, MD        PHYSICAL EXAMINATION: ECOG PERFORMANCE STATUS: 1 - Symptomatic but completely ambulatory    LABORATORY DATA:  I have reviewed the data as listed    Latest Ref Rng & Units 08/06/2020   12:31 PM  CMP  Glucose 70 - 99 mg/dL 161   BUN 6 - 20 mg/dL 15   Creatinine 0.96 - 1.00 mg/dL 0.45   Sodium 409 - 811 mmol/L 138   Potassium 3.5 - 5.1 mmol/L 3.5   Chloride 98 - 111 mmol/L 103   CO2 22 - 32 mmol/L 26   Calcium 8.9 - 10.3 mg/dL 9.0   Total Protein 6.5 - 8.1 g/dL 7.1   Total Bilirubin 0.3 - 1.2 mg/dL 0.8   Alkaline Phos 38 - 126 U/L 28   AST 15 - 41 U/L 15   ALT 0 - 44 U/L 10     Lab Results  Component Value Date   WBC 6.5 09/05/2020   HGB 13.9 09/05/2020   HCT 41.9 09/05/2020   MCV 91.3 09/05/2020   PLT 289 09/05/2020   NEUTROABS 3.9 08/06/2020    ASSESSMENT & PLAN:  Malignant neoplasm of upper-outer quadrant of left breast in female,  estrogen receptor positive (HCC) 09/09/2020: Right lumpectomy: PASH, Right lumpectomy 2.: PASH Left lumpectomy: Grade 2 IDC 2.9 cm, intermediate grade DCIS, margins negative, 0/3 lymph nodes negative, ER 95%, PR 95%, HER2 equal vocal FISH negative, Ki-67 15% Oncotype DX: Score 15, distant recurrence at 9 years: 4%   Treatment plan: 1.  Adjuvant radiation 10/15/2020-11/11/2020 2. followed by adjuvant antiestrogen therapy with tamoxifen 20 mg daily x10 years 11/2020    Anxiety:  She was able to discontinue Effexor  Weight: Stabilized   Tamoxifen toxicities:    1.  Mild hot flashes: Not bothering her 2. fatigue: Uncertain if it is related to tamoxifen. Patient wants to try tart cherry extract to see if it helps her joint symptoms.  Breast cancer surveillance: 1.  Breast exam 07/29/2022: Benign 2. mammogram 04/12/2022: Benign breast density category D 3. Breast MRIs 10/25/2022: Benign breast density category D, recommended annual breast MRIs   For surveillance I recommend switching her from MRIs to a contrast-enhanced  mammogram for next year.  If it works out well then we will discontinue MRIs and will do annual contrast-enhanced mammograms from that point on. ------------------------------------- Assessment and Plan    Breast Cancer Surveillance Two years post-surgery with recent normal breast MRI and mammogram. Dense breast tissue necessitates continued advanced imaging. Discussed the option of contrast-enhanced mammogram as an alternative to MRI. -Order contrast-enhanced mammogram for April 13, 2023.  Tamoxifen Therapy Patient tolerating well with manageable side effects of fatigue and joint pain. Currently in year two of therapy. -Continue Tamoxifen, reassess at year five for potential continuation to year ten. -Consider tart cherry extract for joint pain relief.  General Health Maintenance -Refill Tamoxifen as needed through [facility]. -Follow-up in person in one year.           No orders of the defined types were placed in this encounter.  The patient has a good understanding of the overall plan. she agrees with it. she will call with any problems that may develop before the next visit here. Total time spent: 30 mins including face to face time and time spent for planning, charting and co-ordination of care   Tamsen Meek, MD 11/01/22

## 2022-11-01 NOTE — Assessment & Plan Note (Addendum)
09/09/2020: Right lumpectomy: PASH, Right lumpectomy 2.: PASH Left lumpectomy: Grade 2 IDC 2.9 cm, intermediate grade DCIS, margins negative, 0/3 lymph nodes negative, ER 95%, PR 95%, HER2 equal vocal FISH negative, Ki-67 15% Oncotype DX: Score 15, distant recurrence at 9 years: 4%   Treatment plan: 1.  Adjuvant radiation 10/15/2020-11/11/2020 2. followed by adjuvant antiestrogen therapy with tamoxifen 20 mg daily x10 years 11/2020   Anxiety:  She was able to discontinue Effexor  Weight: Stabilized   Tamoxifen toxicities:    1.  Mild hot flashes: Not bothering her 2. fatigue: Uncertain if it is related to tamoxifen.   Breast cancer surveillance: 1.  Breast exam 07/29/2022: Benign 2. mammogram 04/12/2022: Benign breast density category D 3. Breast MRIs 10/25/2022: Benign breast density category D, recommended annual breast MRIs   For surveillance I recommend switching her from MRIs to a contrast-enhanced mammogram for next year.  If it works out well then we will discontinue MRIs and will do annual contrast-enhanced mammograms from that point on.

## 2022-12-02 ENCOUNTER — Ambulatory Visit: Payer: BC Managed Care – PPO | Admitting: Rehabilitation

## 2022-12-08 ENCOUNTER — Ambulatory Visit: Payer: BC Managed Care – PPO | Attending: General Surgery | Admitting: Rehabilitation

## 2022-12-08 ENCOUNTER — Encounter: Payer: Self-pay | Admitting: Rehabilitation

## 2022-12-08 DIAGNOSIS — Z170 Estrogen receptor positive status [ER+]: Secondary | ICD-10-CM | POA: Insufficient documentation

## 2022-12-08 DIAGNOSIS — Z483 Aftercare following surgery for neoplasm: Secondary | ICD-10-CM | POA: Insufficient documentation

## 2022-12-08 DIAGNOSIS — C50412 Malignant neoplasm of upper-outer quadrant of left female breast: Secondary | ICD-10-CM | POA: Insufficient documentation

## 2022-12-08 NOTE — Therapy (Signed)
OUTPATIENT PHYSICAL THERAPY SOZO SCREENING NOTE   Patient Name: Rachael Garza MRN: [id number] DOB:12/22/1975, 47 y.o., female Today's Date: 12/08/2022  PCP: Creola Corn, MD REFERRING PROVIDER: Griselda Miner, MD   PT End of Session - 12/08/22 1259     Visit Number 1   screen only   PT Start Time 1252    PT Stop Time 1258    PT Time Calculation (min) 6 min    Activity Tolerance Patient tolerated treatment well    Behavior During Therapy [facility] for tasks assessed/performed              Past Medical History:  Diagnosis Date   Anxiety    Cancer (HCC)    left   Encounter for insertion of Mirena IUD    Family history of breast cancer 08/07/2020   Family history of colon cancer 08/07/2020   Family history of melanoma 08/07/2020   History of acne    History of radiation therapy    Left breast 10/14/20-11/12/20 Dr. Antony Blackbird   Migraines    Personal history of radiation therapy    Situational stress    Past Surgical History:  Procedure Laterality Date   ? compression fracture L4 in college due to fall     BREAST EXCISIONAL BIOPSY Right 09/09/2020   PASH   BREAST LUMPECTOMY Left 09/09/2020   BREAST LUMPECTOMY WITH RADIOACTIVE SEED LOCALIZATION Bilateral 09/09/2020   Procedure: LEFT BREAST RADIOACTIVE SEED LOCALIZATION LUMPECTOMY WITH SENITNEL LYMPH NODE, AND RIGHT RADIOACTIVE SEED LOCALIZATION LUMPECTOMY x2;  Surgeon: Griselda Miner, MD;  Location: [facility] OR;  Service: General;  Laterality: Bilateral;   TONSILLECTOMY AND ADENOIDECTOMY     Patient Active Problem List   Diagnosis Date Noted   Genetic testing 08/14/2020   Family history of breast cancer 08/07/2020   Family history of colon cancer 08/07/2020   Family history of melanoma 08/07/2020   Malignant neoplasm of upper-outer quadrant of left breast in female, estrogen receptor positive (HCC) 08/01/2020   Migraine without aura and without status migrainosus, not intractable 03/02/2020   Cervicalgia 03/02/2020    Bilateral occipital neuralgia 03/02/2020    REFERRING DIAG: right breast cancer at risk for lymphedema  THERAPY DIAG:  Aftercare following surgery for neoplasm  Malignant neoplasm of upper-outer quadrant of left breast in female, estrogen receptor positive (HCC)  PERTINENT HISTORY: Rt lumpectomy x 2 due to PASH, Lt lumpectomy wih 0/3 nodes positive.  Currently in radiation and will then have antiestrogens x 10 years   PRECAUTIONS: right UE Lymphedema risk, None  SUBJECTIVE: Pt returns for her 3 month L-Dex screen.   PAIN:  Are you having pain? No  SOZO SCREENING:  Patient was assessed today using the SOZO machine to determine the lymphedema index score. This was compared to her baseline score. It was determined that she is NOT within the recommended range when compared to her baseline but this seems to be consistent for her.  She will continue sleeve wear.      Idamae Lusher, PT 12/08/2022, 1:00 PM

## 2023-02-07 ENCOUNTER — Encounter: Payer: Self-pay | Admitting: Hematology and Oncology

## 2023-02-08 ENCOUNTER — Other Ambulatory Visit: Payer: Self-pay | Admitting: *Deleted

## 2023-02-08 MED ORDER — TAMOXIFEN CITRATE 20 MG PO TABS: 20.0000 mg | ORAL_TABLET | Freq: Every day | ORAL | 3 refills | Status: DC

## 2023-02-23 DIAGNOSIS — L578 Other skin changes due to chronic exposure to nonionizing radiation: Secondary | ICD-10-CM | POA: Diagnosis not present

## 2023-02-23 DIAGNOSIS — L821 Other seborrheic keratosis: Secondary | ICD-10-CM | POA: Diagnosis not present

## 2023-02-23 DIAGNOSIS — B078 Other viral warts: Secondary | ICD-10-CM | POA: Diagnosis not present

## 2023-02-23 DIAGNOSIS — D485 Neoplasm of uncertain behavior of skin: Secondary | ICD-10-CM | POA: Diagnosis not present

## 2023-02-23 DIAGNOSIS — L814 Other melanin hyperpigmentation: Secondary | ICD-10-CM | POA: Diagnosis not present

## 2023-02-23 DIAGNOSIS — Z86018 Personal history of other benign neoplasm: Secondary | ICD-10-CM | POA: Diagnosis not present

## 2023-02-23 DIAGNOSIS — L570 Actinic keratosis: Secondary | ICD-10-CM | POA: Diagnosis not present

## 2023-02-23 DIAGNOSIS — D0361 Melanoma in situ of right upper limb, including shoulder: Secondary | ICD-10-CM | POA: Diagnosis not present

## 2023-03-02 ENCOUNTER — Other Ambulatory Visit: Payer: Self-pay | Admitting: Internal Medicine

## 2023-03-02 DIAGNOSIS — Z09 Encounter for follow-up examination after completed treatment for conditions other than malignant neoplasm: Secondary | ICD-10-CM

## 2023-03-10 DIAGNOSIS — R300 Dysuria: Secondary | ICD-10-CM | POA: Diagnosis not present

## 2023-03-14 DIAGNOSIS — E7800 Pure hypercholesterolemia, unspecified: Secondary | ICD-10-CM | POA: Diagnosis not present

## 2023-03-14 DIAGNOSIS — Z1212 Encounter for screening for malignant neoplasm of rectum: Secondary | ICD-10-CM | POA: Diagnosis not present

## 2023-03-15 DIAGNOSIS — D2361 Other benign neoplasm of skin of right upper limb, including shoulder: Secondary | ICD-10-CM | POA: Diagnosis not present

## 2023-03-15 DIAGNOSIS — L905 Scar conditions and fibrosis of skin: Secondary | ICD-10-CM | POA: Diagnosis not present

## 2023-03-15 DIAGNOSIS — D0361 Melanoma in situ of right upper limb, including shoulder: Secondary | ICD-10-CM | POA: Diagnosis not present

## 2023-03-21 ENCOUNTER — Encounter: Payer: Self-pay | Admitting: Hematology and Oncology

## 2023-03-21 DIAGNOSIS — R82998 Other abnormal findings in urine: Secondary | ICD-10-CM | POA: Diagnosis not present

## 2023-03-21 DIAGNOSIS — C50412 Malignant neoplasm of upper-outer quadrant of left female breast: Secondary | ICD-10-CM | POA: Diagnosis not present

## 2023-03-21 DIAGNOSIS — Z0000 Encounter for general adult medical examination without abnormal findings: Secondary | ICD-10-CM | POA: Diagnosis not present

## 2023-03-21 DIAGNOSIS — Z1331 Encounter for screening for depression: Secondary | ICD-10-CM | POA: Diagnosis not present

## 2023-03-21 DIAGNOSIS — Z1339 Encounter for screening examination for other mental health and behavioral disorders: Secondary | ICD-10-CM | POA: Diagnosis not present

## 2023-03-22 ENCOUNTER — Other Ambulatory Visit: Payer: Self-pay | Admitting: *Deleted

## 2023-03-22 DIAGNOSIS — C50412 Malignant neoplasm of upper-outer quadrant of left female breast: Secondary | ICD-10-CM

## 2023-03-22 NOTE — Progress Notes (Signed)
Per MD pt needing to undergo contrast enhanced mammograms.  Orders placed, pt notified to schedule.

## 2023-04-19 ENCOUNTER — Other Ambulatory Visit: Payer: Self-pay | Admitting: Hematology and Oncology

## 2023-04-19 ENCOUNTER — Ambulatory Visit
Admission: RE | Admit: 2023-04-19 | Discharge: 2023-04-19 | Disposition: A | Discharge status: Home / Self Care | Source: Ambulatory Visit | Attending: Hematology and Oncology | Admitting: Hematology and Oncology

## 2023-04-19 DIAGNOSIS — Z9013 Acquired absence of bilateral breasts and nipples: Secondary | ICD-10-CM | POA: Diagnosis not present

## 2023-04-19 DIAGNOSIS — C50412 Malignant neoplasm of upper-outer quadrant of left female breast: Secondary | ICD-10-CM

## 2023-04-19 DIAGNOSIS — Z853 Personal history of malignant neoplasm of breast: Secondary | ICD-10-CM | POA: Diagnosis not present

## 2023-04-19 MED ORDER — IOPAMIDOL (ISOVUE-370) INJECTION 76%
100.0000 mL | Freq: Once | INTRAVENOUS | Status: AC | PRN
Start: 2023-04-19 — End: 2023-04-19
  Administered 2023-04-19: 100 mL via INTRAVENOUS

## 2023-04-25 ENCOUNTER — Encounter: Payer: Self-pay | Admitting: Hematology and Oncology

## 2023-05-03 DIAGNOSIS — G43909 Migraine, unspecified, not intractable, without status migrainosus: Secondary | ICD-10-CM | POA: Diagnosis not present

## 2023-05-04 DIAGNOSIS — Z01419 Encounter for gynecological examination (general) (routine) without abnormal findings: Secondary | ICD-10-CM | POA: Diagnosis not present

## 2023-05-04 DIAGNOSIS — R350 Frequency of micturition: Secondary | ICD-10-CM | POA: Diagnosis not present

## 2023-05-12 ENCOUNTER — Ambulatory Visit: Admitting: Rehabilitation

## 2023-05-23 ENCOUNTER — Ambulatory Visit: Attending: General Surgery

## 2023-05-23 VITALS — Wt 125.4 lb

## 2023-05-23 DIAGNOSIS — Z483 Aftercare following surgery for neoplasm: Secondary | ICD-10-CM | POA: Insufficient documentation

## 2023-05-23 NOTE — Therapy (Signed)
 OUTPATIENT PHYSICAL THERAPY SOZO SCREENING NOTE   Patient Name: Rachael Garza MRN: [id number] DOB:1975-05-14, 48 y.o., female Today's Date: 05/23/2023  PCP: Creola Corn, MD REFERRING PROVIDER: Griselda Miner, MD   PT End of Session - 05/23/23 [phone]     Visit Number 1   # unchanged due to screen only   PT Start Time 0841    PT Stop Time 0845    PT Time Calculation (min) 4 min    Activity Tolerance Patient tolerated treatment well    Behavior During Therapy [facility] for tasks assessed/performed              Past Medical History:  Diagnosis Date   Anxiety    Cancer (HCC)    left   Encounter for insertion of Mirena IUD    Family history of breast cancer 08/07/2020   Family history of colon cancer 08/07/2020   Family history of melanoma 08/07/2020   History of acne    History of radiation therapy    Left breast 10/14/20-11/12/20 Dr. Antony Blackbird   Migraines    Personal history of radiation therapy    Situational stress    Past Surgical History:  Procedure Laterality Date   ? compression fracture L4 in college due to fall     BREAST EXCISIONAL BIOPSY Right 09/09/2020   PASH   BREAST LUMPECTOMY Left 09/09/2020   BREAST LUMPECTOMY WITH RADIOACTIVE SEED LOCALIZATION Bilateral 09/09/2020   Procedure: LEFT BREAST RADIOACTIVE SEED LOCALIZATION LUMPECTOMY WITH SENITNEL LYMPH NODE, AND RIGHT RADIOACTIVE SEED LOCALIZATION LUMPECTOMY x2;  Surgeon: Griselda Miner, MD;  Location: [facility] OR;  Service: General;  Laterality: Bilateral;   TONSILLECTOMY AND ADENOIDECTOMY     Patient Active Problem List   Diagnosis Date Noted   Genetic testing 08/14/2020   Family history of breast cancer 08/07/2020   Family history of colon cancer 08/07/2020   Family history of melanoma 08/07/2020   Malignant neoplasm of upper-outer quadrant of left breast in female, estrogen receptor positive (HCC) 08/01/2020   Migraine without aura and without status migrainosus, not intractable 03/02/2020   Cervicalgia  03/02/2020   Bilateral occipital neuralgia 03/02/2020    REFERRING DIAG: right breast cancer at risk for lymphedema  THERAPY DIAG:  Aftercare following surgery for neoplasm  PERTINENT HISTORY: Rt lumpectomy x 2 due to PASH, Lt lumpectomy Aug 2022, wih 0/3 nodes positive.  Currently in radiation and will then have antiestrogens x 10 years   PRECAUTIONS: right UE Lymphedema risk, None  SUBJECTIVE: Pt returns for her 3 month L-Dex screen.   PAIN:  Are you having pain? No  SOZO SCREENING:  Patient was assessed today using the SOZO machine to determine the lymphedema index score. This was compared to her baseline score. It was determined that she is within the recommended range when compared to her baseline and no further action is needed at this time. She will continue SOZO screenings. These are done every 3 months for 2 years post operatively followed by every 6 months for 2 years, and then annually.    L-DEX FLOWSHEETS - 05/23/23 0800       L-DEX LYMPHEDEMA SCREENING   Measurement Type Unilateral    L-DEX MEASUREMENT EXTREMITY Upper Extremity    POSITION  Standing    DOMINANT SIDE Right    At Risk Side Left    BASELINE SCORE (UNILATERAL) -4.1    L-DEX SCORE (UNILATERAL) 1    VALUE CHANGE (UNILAT) 5.1  P: Will cont with 1 more 3 month to cont to monitor pt as she has multiple elevated changes from baseline.   Denyce Flank, PTA 05/23/2023, 8:45 AM

## 2023-05-31 ENCOUNTER — Institutional Professional Consult (permissible substitution): Payer: BC Managed Care – PPO | Admitting: Plastic Surgery

## 2023-06-03 ENCOUNTER — Institutional Professional Consult (permissible substitution): Payer: BC Managed Care – PPO | Admitting: Plastic Surgery

## 2023-06-21 DIAGNOSIS — M9901 Segmental and somatic dysfunction of cervical region: Secondary | ICD-10-CM | POA: Diagnosis not present

## 2023-06-21 DIAGNOSIS — M5382 Other specified dorsopathies, cervical region: Secondary | ICD-10-CM | POA: Diagnosis not present

## 2023-06-21 DIAGNOSIS — M9903 Segmental and somatic dysfunction of lumbar region: Secondary | ICD-10-CM | POA: Diagnosis not present

## 2023-06-21 DIAGNOSIS — M9900 Segmental and somatic dysfunction of head region: Secondary | ICD-10-CM | POA: Diagnosis not present

## 2023-06-21 DIAGNOSIS — M7542 Impingement syndrome of left shoulder: Secondary | ICD-10-CM | POA: Diagnosis not present

## 2023-06-30 DIAGNOSIS — M9903 Segmental and somatic dysfunction of lumbar region: Secondary | ICD-10-CM | POA: Diagnosis not present

## 2023-06-30 DIAGNOSIS — M7542 Impingement syndrome of left shoulder: Secondary | ICD-10-CM | POA: Diagnosis not present

## 2023-06-30 DIAGNOSIS — M9901 Segmental and somatic dysfunction of cervical region: Secondary | ICD-10-CM | POA: Diagnosis not present

## 2023-06-30 DIAGNOSIS — M5382 Other specified dorsopathies, cervical region: Secondary | ICD-10-CM | POA: Diagnosis not present

## 2023-06-30 DIAGNOSIS — M9900 Segmental and somatic dysfunction of head region: Secondary | ICD-10-CM | POA: Diagnosis not present

## 2023-07-15 ENCOUNTER — Encounter: Payer: Self-pay | Admitting: Plastic Surgery

## 2023-07-15 ENCOUNTER — Ambulatory Visit (INDEPENDENT_AMBULATORY_CARE_PROVIDER_SITE_OTHER): Payer: Self-pay | Admitting: Plastic Surgery

## 2023-07-15 VITALS — BP 109/73 | HR 67 | Ht 66.0 in | Wt 128.0 lb

## 2023-07-15 DIAGNOSIS — Z719 Counseling, unspecified: Secondary | ICD-10-CM | POA: Insufficient documentation

## 2023-07-15 NOTE — Progress Notes (Signed)
 Patient ID: Rachael Garza, female    DOB: [birth year], 48 y.o.   MRN: [id number]   Chief Complaint  Patient presents with   consult    Is a 48 year old female here for evaluation of her neck and face.  She is pretty healthy with a history of breast cancer that was treated with a lumpectomy and radiation.  She has some excess lax skin in the chin and neck area.  She has a little bit of gelling but overall she is looks good.  She has a lot of hyperpigmentation.  She has a Fitzpatrick 1.  Has a little bit of loss of midface fullness as well.    Review of Systems  Constitutional: Negative.   Eyes: Negative.   Respiratory: Negative.    Cardiovascular: Negative.   Gastrointestinal: Negative.   Endocrine: Negative.   Genitourinary: Negative.     Past Medical History:  Diagnosis Date   Anxiety    Cancer (HCC)    left   Encounter for insertion of Mirena IUD    Family history of breast cancer 08/07/2020   Family history of colon cancer 08/07/2020   Family history of melanoma 08/07/2020   History of acne    History of radiation therapy    Left breast 10/14/20-11/12/20 Dr. Retta Caster   Migraines    Personal history of radiation therapy    Situational stress     Past Surgical History:  Procedure Laterality Date   ? compression fracture L4 in college due to fall     BREAST EXCISIONAL BIOPSY Right 09/09/2020   PASH   BREAST LUMPECTOMY Left 09/09/2020   BREAST LUMPECTOMY WITH RADIOACTIVE SEED LOCALIZATION Bilateral 09/09/2020   Procedure: LEFT BREAST RADIOACTIVE SEED LOCALIZATION LUMPECTOMY WITH SENITNEL LYMPH NODE, AND RIGHT RADIOACTIVE SEED LOCALIZATION LUMPECTOMY x2;  Surgeon: Caralyn Chandler, MD;  Location: [facility] OR;  Service: General;  Laterality: Bilateral;   TONSILLECTOMY AND ADENOIDECTOMY        Current Outpatient Medications:    levonorgestrel (MIRENA, 52 MG,) 20 MCG/24HR IUD, Mirena 20 mcg/24 hours (7 yrs) 52 mg intrauterine device  Take 1 device every day by  intrauterine route as directed., Disp: , Rfl:    melatonin 3 MG TABS tablet, Take 3 mg by mouth at bedtime as needed (sleep)., Disp: , Rfl:    tamoxifen  (NOLVADEX ) 20 MG tablet, Take 1 tablet (20 mg total) by mouth daily., Disp: 90 tablet, Rfl: 3   valACYclovir (VALTREX) 500 MG tablet, Take 1 tablet po 2x a day prn at 1st sign of cold sores (Patient not taking: Reported on 07/15/2023), Disp: , Rfl:  No current facility-administered medications for this visit.  Facility-Administered Medications Ordered in Other Visits:    Sonafine emulsion 1 application, 1 application , Topical, Once, Retta Caster, MD   Objective:   Vitals:   07/15/23 1015  BP: 109/73  Pulse: 67  SpO2: 99%    Physical Exam Vitals reviewed.  Constitutional:      Appearance: Normal appearance.  HENT:     Head: Atraumatic.  Cardiovascular:     Rate and Rhythm: Normal rate.     Pulses: Normal pulses.  Skin:    Capillary Refill: Capillary refill takes less than 2 seconds.  Neurological:     Mental Status: She is alert and oriented to person, place, and time.  Psychiatric:        Mood and Affect: Mood normal.        Behavior: Behavior normal.  Thought Content: Thought content normal.        Judgment: Judgment normal.     Assessment & Plan:  Encounter for counseling  Patient would benefit from skin care treatment.  I recommend she see Lily.  She would do really well with the BBL series and then with a halo.  I did express that none of this will resolve the neck issue but it will make her skin much healthier.  She will eventually probably want a lower face neck lift.  I can give her "just so she has an estimate.  She is good to think things over and then let us  know. Pictures were obtained of the patient and placed in the chart with the patient's or guardian's permission.    [name], DO

## 2023-08-15 DIAGNOSIS — R300 Dysuria: Secondary | ICD-10-CM | POA: Diagnosis not present

## 2023-08-17 DIAGNOSIS — M7542 Impingement syndrome of left shoulder: Secondary | ICD-10-CM | POA: Diagnosis not present

## 2023-08-17 DIAGNOSIS — M5382 Other specified dorsopathies, cervical region: Secondary | ICD-10-CM | POA: Diagnosis not present

## 2023-08-17 DIAGNOSIS — M9900 Segmental and somatic dysfunction of head region: Secondary | ICD-10-CM | POA: Diagnosis not present

## 2023-08-17 DIAGNOSIS — M9901 Segmental and somatic dysfunction of cervical region: Secondary | ICD-10-CM | POA: Diagnosis not present

## 2023-08-21 ENCOUNTER — Encounter: Payer: Self-pay | Admitting: Hematology and Oncology

## 2023-08-22 ENCOUNTER — Ambulatory Visit

## 2023-08-23 DIAGNOSIS — M9901 Segmental and somatic dysfunction of cervical region: Secondary | ICD-10-CM | POA: Diagnosis not present

## 2023-08-23 DIAGNOSIS — M9903 Segmental and somatic dysfunction of lumbar region: Secondary | ICD-10-CM | POA: Diagnosis not present

## 2023-08-23 DIAGNOSIS — M7542 Impingement syndrome of left shoulder: Secondary | ICD-10-CM | POA: Diagnosis not present

## 2023-08-23 DIAGNOSIS — M5382 Other specified dorsopathies, cervical region: Secondary | ICD-10-CM | POA: Diagnosis not present

## 2023-08-23 DIAGNOSIS — M9900 Segmental and somatic dysfunction of head region: Secondary | ICD-10-CM | POA: Diagnosis not present

## 2023-09-01 DIAGNOSIS — L821 Other seborrheic keratosis: Secondary | ICD-10-CM | POA: Diagnosis not present

## 2023-09-01 DIAGNOSIS — L814 Other melanin hyperpigmentation: Secondary | ICD-10-CM | POA: Diagnosis not present

## 2023-09-01 DIAGNOSIS — Z86018 Personal history of other benign neoplasm: Secondary | ICD-10-CM | POA: Diagnosis not present

## 2023-09-01 DIAGNOSIS — L578 Other skin changes due to chronic exposure to nonionizing radiation: Secondary | ICD-10-CM | POA: Diagnosis not present

## 2023-09-01 DIAGNOSIS — L820 Inflamed seborrheic keratosis: Secondary | ICD-10-CM | POA: Diagnosis not present

## 2023-09-06 DIAGNOSIS — M5382 Other specified dorsopathies, cervical region: Secondary | ICD-10-CM | POA: Diagnosis not present

## 2023-09-06 DIAGNOSIS — M9903 Segmental and somatic dysfunction of lumbar region: Secondary | ICD-10-CM | POA: Diagnosis not present

## 2023-09-06 DIAGNOSIS — M9901 Segmental and somatic dysfunction of cervical region: Secondary | ICD-10-CM | POA: Diagnosis not present

## 2023-09-06 DIAGNOSIS — M7542 Impingement syndrome of left shoulder: Secondary | ICD-10-CM | POA: Diagnosis not present

## 2023-09-06 DIAGNOSIS — M9900 Segmental and somatic dysfunction of head region: Secondary | ICD-10-CM | POA: Diagnosis not present

## 2023-10-03 ENCOUNTER — Ambulatory Visit: Attending: General Surgery

## 2023-10-03 VITALS — Wt 127.4 lb

## 2023-10-03 DIAGNOSIS — Z483 Aftercare following surgery for neoplasm: Secondary | ICD-10-CM | POA: Insufficient documentation

## 2023-10-03 NOTE — Therapy (Signed)
 OUTPATIENT PHYSICAL THERAPY SOZO SCREENING NOTE   Patient Name: Rachael Garza MRN: [id number] DOB:1975/05/15, 48 y.o., female Today's Date: 10/03/2023  PCP: Onita Rush, MD REFERRING PROVIDER: Curvin Deward MOULD, MD   PT End of Session - 10/03/23 1702     Visit Number 1   # unchanged due to screen only   PT Start Time 1700    PT Stop Time 1704    PT Time Calculation (min) 4 min    Activity Tolerance Patient tolerated treatment well    Behavior During Therapy [facility] for tasks assessed/performed           Past Medical History:  Diagnosis Date   Anxiety    Cancer (HCC)    left   Encounter for insertion of Mirena IUD    Family history of breast cancer 08/07/2020   Family history of colon cancer 08/07/2020   Family history of melanoma 08/07/2020   History of acne    History of radiation therapy    Left breast 10/14/20-11/12/20 Dr. Lynwood Nasuti   Migraines    Personal history of radiation therapy    Situational stress    Past Surgical History:  Procedure Laterality Date   ? compression fracture L4 in college due to fall     BREAST EXCISIONAL BIOPSY Right 09/09/2020   PASH   BREAST LUMPECTOMY Left 09/09/2020   BREAST LUMPECTOMY WITH RADIOACTIVE SEED LOCALIZATION Bilateral 09/09/2020   Procedure: LEFT BREAST RADIOACTIVE SEED LOCALIZATION LUMPECTOMY WITH SENITNEL LYMPH NODE, AND RIGHT RADIOACTIVE SEED LOCALIZATION LUMPECTOMY x2;  Surgeon: Curvin Deward MOULD, MD;  Location: [facility] OR;  Service: General;  Laterality: Bilateral;   TONSILLECTOMY AND ADENOIDECTOMY     Patient Active Problem List   Diagnosis Date Noted   Encounter for counseling 07/15/2023   Genetic testing 08/14/2020   Family history of breast cancer 08/07/2020   Family history of colon cancer 08/07/2020   Family history of melanoma 08/07/2020   Malignant neoplasm of upper-outer quadrant of left breast in female, estrogen receptor positive (HCC) 08/01/2020   Migraine without aura and without status migrainosus, not  intractable 03/02/2020   Cervicalgia 03/02/2020   Bilateral occipital neuralgia 03/02/2020    REFERRING DIAG: right breast cancer at risk for lymphedema  THERAPY DIAG:  Aftercare following surgery for neoplasm  PERTINENT HISTORY: Rt lumpectomy x 2 due to PASH, Lt lumpectomy Aug 2022, wih 0/3 nodes positive.  Currently in radiation and will then have antiestrogens x 10 years   PRECAUTIONS: right UE Lymphedema risk, None  SUBJECTIVE: Pt returns for her 3 month L-Dex screen. I haven't worn my sleeve much at all this summer because my arm was good last time I was here.  PAIN:  Are you having pain? No  SOZO SCREENING:  Patient was assessed today using the SOZO machine to determine the lymphedema index score. This was compared to her baseline score. It was determined that she is NOT within the recommended range when compared to her baseline. She has a compression sleeve that she will resume wearing, however it is a year old so was advised to get a new one, see below for this info. It is recommended she return in 1 month to be reassessed. If she continues to measure outside the recommended range, physical therapy treatment will be recommended at that time and a referral requested.  Suggested pt get a new compression sleeve since she has had her current one for a year. Gave her 2 options to get a new sleeve. She  can call [facility] or MyChart message me her size so I can set her up an acct with [employer] for 15% off. Pt verbalizes understanding and will let this therapist know if she needs anything else.   L-DEX FLOWSHEETS - 10/03/23 1700       L-DEX LYMPHEDEMA SCREENING   Measurement Type Unilateral    L-DEX MEASUREMENT EXTREMITY Upper Extremity    POSITION  Standing    DOMINANT SIDE Right    At Risk Side Left    BASELINE SCORE (UNILATERAL) -4.1    L-DEX SCORE (UNILATERAL) 2.6    VALUE CHANGE (UNILAT) 6.7           P: Return in 1 month due to elevated change from baseline  again. Did pt get new compression sleeve?   Aden Berwyn Caldron, PTA 10/03/2023, 5:03 PM

## 2023-11-01 ENCOUNTER — Inpatient Hospital Stay: Attending: Hematology and Oncology | Admitting: Hematology and Oncology

## 2023-11-01 VITALS — BP 110/72 | HR 79 | Temp 99.0°F | Resp 18 | Ht 66.0 in | Wt 128.7 lb

## 2023-11-01 DIAGNOSIS — F419 Anxiety disorder, unspecified: Secondary | ICD-10-CM | POA: Insufficient documentation

## 2023-11-01 DIAGNOSIS — Z170 Estrogen receptor positive status [ER+]: Secondary | ICD-10-CM | POA: Insufficient documentation

## 2023-11-01 DIAGNOSIS — Z1721 Progesterone receptor positive status: Secondary | ICD-10-CM | POA: Diagnosis not present

## 2023-11-01 DIAGNOSIS — Z923 Personal history of irradiation: Secondary | ICD-10-CM | POA: Insufficient documentation

## 2023-11-01 DIAGNOSIS — Z1732 Human epidermal growth factor receptor 2 negative status: Secondary | ICD-10-CM | POA: Insufficient documentation

## 2023-11-01 DIAGNOSIS — Z79810 Long term (current) use of selective estrogen receptor modulators (SERMs): Secondary | ICD-10-CM | POA: Insufficient documentation

## 2023-11-01 DIAGNOSIS — M2559 Pain in other specified joint: Secondary | ICD-10-CM | POA: Diagnosis not present

## 2023-11-01 DIAGNOSIS — C50412 Malignant neoplasm of upper-outer quadrant of left female breast: Secondary | ICD-10-CM | POA: Insufficient documentation

## 2023-11-01 MED ORDER — TAMOXIFEN CITRATE 20 MG PO TABS: 20.0000 mg | ORAL_TABLET | Freq: Every day | ORAL | 3 refills | Status: AC

## 2023-11-01 NOTE — Progress Notes (Signed)
 Patient Care Team: Onita Rush, MD as PCP - General (Internal Medicine) Shannon Agent, MD as Consulting Physician (Radiation Oncology) Odean Potts, MD as Consulting Physician (Hematology and Oncology) Curvin Deward MOULD, MD as Consulting Physician (General Surgery)  DIAGNOSIS:  Encounter Diagnosis  Name Primary?   Malignant neoplasm of upper-outer quadrant of left breast in female, estrogen receptor positive (HCC) Yes    SUMMARY OF ONCOLOGIC HISTORY: Oncology History  Malignant neoplasm of upper-outer quadrant of left breast in female, estrogen receptor positive (HCC)  07/29/2020 Initial Diagnosis    Palpable left breast mass (very dense breasts), Mammogram revealed 2.9 cm masss, MRI 3.2 cm mass: Biopsy Grade 2 IDC ER 95%, PR 95%, Her 2 Neg, Ki 67 15% MRI on Right breast: 3.7 cm NME LIQ and 1.2 cm NME UIQ: Needs biopsies   08/06/2020 Cancer Staging   Staging form: Breast, AJCC 8th Edition - Clinical stage from 08/06/2020: Stage IB (cT2, cN0, cM0, G2, ER+, PR+, HER2-) - Signed by Odean Potts, MD on 08/06/2020 Stage prefix: Initial diagnosis Histologic grading system: 3 grade system Laterality: Left Staged by: Pathologist and managing physician Stage used in treatment planning: Yes National guidelines used in treatment planning: Yes Type of national guideline used in treatment planning: NCCN   08/13/2020 Genetic Testing   No pathogenic variants detected in [facility] BRCAPlus Panel or CancerNext-Expanded +RNAinsight Panel.  Variant of uncertain significance detected in BLM at  p.A914T (c.2740G>A).  The report dates are August 13, 2020 and August 18, 2020, respectively.    The BRCAplus panel offered by [employer] and includes sequencing and deletion/duplication analysis for the following 8 genes: ATM, BRCA1, BRCA2, CDH1, CHEK2, PALB2, PTEN, and TP53.   The CancerNext-Expanded gene panel offered by [facility] and includes sequencing, rearrangement, and RNA analysis for the following 77  genes: AIP, ALK, APC, ATM, AXIN2, BAP1, BARD1, BLM, BMPR1A, BRCA1, BRCA2, BRIP1, CDC73, CDH1, CDK4, CDKN1B, CDKN2A, CHEK2, CTNNA1, DICER1, FANCC, FH, FLCN, GALNT12, KIF1B, LZTR1, MAX, MEN1, MET, MLH1, MSH2, MSH3, MSH6, MUTYH, NBN, NF1, NF2, NTHL1, PALB2, PHOX2B, PMS2, POT1, PRKAR1A, PTCH1, PTEN, RAD51C, RAD51D, RB1, RECQL, RET, SDHA, SDHAF2, SDHB, SDHC, SDHD, SMAD4, SMARCA4, SMARCB1, SMARCE1, STK11, SUFU, TMEM127, TP53, TSC1, TSC2, VHL and XRCC2 (sequencing and deletion/duplication); EGFR, EGLN1, HOXB13, KIT, MITF, PDGFRA, POLD1, and POLE (sequencing only); EPCAM and GREM1 (deletion/duplication only).    09/09/2020 Surgery   Right lumpectomy: PASH Right lumpectomy 2.: PASH Left lumpectomy: Grade 2 IDC 2.9 cm, intermediate grade DCIS, margins negative, 0/3 lymph nodes negative, ER 95%, PR 95%, HER2 equal vocal FISH negative, Ki-67 15%   09/09/2020 Oncotype testing   15/4%   09/22/2020 Cancer Staging   Staging form: Breast, AJCC 8th Edition - Pathologic: Stage IA (pT2, pN0, cM0, G2, ER+, PR+, HER2-, Oncotype DX score: 15) - Signed by Odean Potts, MD on 09/22/2020 Stage prefix: Initial diagnosis Multigene prognostic tests performed: Oncotype DX Recurrence score range: Greater than or equal to 11 Histologic grading system: 3 grade system   10/15/2020 - 11/11/2020 Radiation Therapy   Adjuvant radiation   11/27/2020 -  Anti-estrogen oral therapy   Tamoxifen  20 mg daily     CHIEF COMPLIANT: Surveillance of breast cancer on tamoxifen   HISTORY OF PRESENT ILLNESS:   History of Present Illness Rachael Garza is a 48 year old female with breast cancer who presents for follow-up regarding her current treatment with tamoxifen .  She experiences tightness in her arm on the side where lymph nodes were removed, with associated shoulder and neck pain.  There is no significant swelling, but her knuckles have enlarged   She takes tamoxifen  and experiences joint pain, particularly in her hands, with weakness  and difficulty squeezing. There is soreness in her pinky finger with a possible bump. She occasionally misses doses of tamoxifen , especially when consuming alcohol , and experiences anxiety episodes related to these instances.  She has a Mirena IUD and experiences occasional bleeding, indicating she is not fully menopausal. She is uncertain about the timing of IUD removal and its implications for her hormonal status. She expresses concern about breast cancer recurrence and the need for birth control while on tamoxifen .  She experiences anxiety and attributes some symptoms to menopause, with occasional night sweats but no severe hot flashes. She experiences fatigue and health-related anxiety, which she attributes to her current treatment and life circumstances.     ALLERGIES:  is allergic to caffeine.  MEDICATIONS:  Current Outpatient Medications  Medication Sig Dispense Refill   levonorgestrel (MIRENA, 52 MG,) 20 MCG/24HR IUD Mirena 20 mcg/24 hours (7 yrs) 52 mg intrauterine device  Take 1 device every day by intrauterine route as directed.     tamoxifen  (NOLVADEX ) 20 MG tablet Take 1 tablet (20 mg total) by mouth daily. 90 tablet 3   melatonin 3 MG TABS tablet Take 3 mg by mouth at bedtime as needed (sleep). (Patient not taking: Reported on 11/01/2023)     valACYclovir (VALTREX) 500 MG tablet Take 1 tablet po 2x a day prn at 1st sign of cold sores (Patient not taking: Reported on 11/01/2023)     No current facility-administered medications for this visit.   Facility-Administered Medications Ordered in Other Visits  Medication Dose Route Frequency Provider Last Rate Last Admin   Sonafine emulsion 1 application  1 application  Topical Once Shannon Agent, MD        PHYSICAL EXAMINATION: ECOG PERFORMANCE STATUS: 1 - Symptomatic but completely ambulatory  Vitals:   11/01/23 1150  BP: 110/72  Pulse: 79  Resp: 18  Temp: 99 F (37.2 C)  SpO2: 100%   Filed Weights   11/01/23 1150   Weight: 128 lb 11.2 oz (58.4 kg)    Physical Exam   (exam performed in the presence of a chaperone)  LABORATORY DATA:  I have reviewed the data as listed    Latest Ref Rng & Units 08/06/2020   12:31 PM  CMP  Glucose 70 - 99 mg/dL 836   BUN 6 - 20 mg/dL 15   Creatinine 9.55 - 1.00 mg/dL 9.25   Sodium 864 - 854 mmol/L 138   Potassium 3.5 - 5.1 mmol/L 3.5   Chloride 98 - 111 mmol/L 103   CO2 22 - 32 mmol/L 26   Calcium 8.9 - 10.3 mg/dL 9.0   Total Protein 6.5 - 8.1 g/dL 7.1   Total Bilirubin 0.3 - 1.2 mg/dL 0.8   Alkaline Phos 38 - 126 U/L 28   AST 15 - 41 U/L 15   ALT 0 - 44 U/L 10     Lab Results  Component Value Date   WBC 6.5 09/05/2020   HGB 13.9 09/05/2020   HCT 41.9 09/05/2020   MCV 91.3 09/05/2020   PLT 289 09/05/2020   NEUTROABS 3.9 08/06/2020    ASSESSMENT & PLAN:  Malignant neoplasm of upper-outer quadrant of left breast in female, estrogen receptor positive (HCC) 09/09/2020: Right lumpectomy: PASH, Right lumpectomy 2.: PASH Left lumpectomy: Grade 2 IDC 2.9 cm, intermediate grade DCIS, margins negative, 0/3 lymph nodes negative,  ER 95%, PR 95%, HER2 equal vocal FISH negative, Ki-67 15% Oncotype DX: Score 15, distant recurrence at 9 years: 4%   Treatment plan: 1.  Adjuvant radiation 10/15/2020-11/11/2020 2. followed by adjuvant antiestrogen therapy with tamoxifen  20 mg daily x10 years 11/2020     Anxiety:  She was able to discontinue Effexor   Weight: Stabilized   Tamoxifen  toxicities:    1.  Mild hot flashes: Not bothering her 2. fatigue: Uncertain if it is related to tamoxifen . Patient wants to try tart cherry extract to see if it helps her joint symptoms.   Breast cancer surveillance: 1.  Breast exam 07/29/2022: Benign 2.  Contrast-enhanced mammogram 04/19/2023: Benign breast density category D  Will plan to do breast MRIs every other year. I ordered a contrast-enhanced mammogram to be done in March 2026.     Assessment & Plan Left upper-outer  quadrant breast cancer, status post surgery Three years post-surgery on adjuvant tamoxifen . Discussed side effects, clot risk, and post-menopausal therapy options. - Continue tamoxifen  for ten years. - Consider aromatase inhibitors post-menopause. - Monitor for blood clots. - Wear compression socks or hold tamoxifen  before long flights. - Ensure tamoxifen  refills.  Tamoxifen -induced arthralgia Joint pain likely due to tamoxifen . Discussed side effects and post-menopausal therapy options. - Consider vitamin D3, 2000 IU daily. - Consider aromatase inhibitors post-menopause if pain persists.  Anxiety Anxiety possibly linked to tamoxifen  and stress. Discussed mood side effects and post-menopausal therapy options. - Consider aromatase inhibitors post-menopause if anxiety persists.      No orders of the defined types were placed in this encounter.  The patient has a good understanding of the overall plan. she agrees with it. she will call with any problems that may develop before the next visit here. Total time spent: 30 mins including face to face time and time spent for planning, charting and co-ordination of care   Naomi MARLA Chad, MD 11/01/23

## 2023-11-01 NOTE — Assessment & Plan Note (Signed)
 09/09/2020: Right lumpectomy: PASH, Right lumpectomy 2.: PASH Left lumpectomy: Grade 2 IDC 2.9 cm, intermediate grade DCIS, margins negative, 0/3 lymph nodes negative, ER 95%, PR 95%, HER2 equal vocal FISH negative, Ki-67 15% Oncotype DX: Score 15, distant recurrence at 9 years: 4%   Treatment plan: 1.  Adjuvant radiation 10/15/2020-11/11/2020 2. followed by adjuvant antiestrogen therapy with tamoxifen  20 mg daily x10 years 11/2020     Anxiety:  She was able to discontinue Effexor   Weight: Stabilized   Tamoxifen  toxicities:    1.  Mild hot flashes: Not bothering her 2. fatigue: Uncertain if it is related to tamoxifen . Patient wants to try tart cherry extract to see if it helps her joint symptoms.   Breast cancer surveillance: 1.  Breast exam 07/29/2022: Benign 2.  Contrast-enhanced mammogram 04/19/2023: Benign breast density category D  3. Breast MRIs 10/25/2022: Benign breast density category D, recommended annual breast MRIs   Recommended discontinuation of breast MRIs and continuing with contrast-enhanced mammograms annually

## 2023-11-02 ENCOUNTER — Other Ambulatory Visit: Payer: Self-pay | Admitting: *Deleted

## 2023-11-02 ENCOUNTER — Other Ambulatory Visit

## 2023-11-02 ENCOUNTER — Encounter: Payer: Self-pay | Admitting: Hematology and Oncology

## 2023-11-02 DIAGNOSIS — C50412 Malignant neoplasm of upper-outer quadrant of left female breast: Secondary | ICD-10-CM

## 2023-11-02 NOTE — Progress Notes (Signed)
 Received mychart message from pt stating her GYN has indicated that her Mirena IUD isn't not due to be removed until 2027.  Per MD pt needing labs to check menopause status and f/u 1 week later to discuss the need for IUD.  Pt notified, appts scheduled, pt verbalized understanding.

## 2023-11-03 ENCOUNTER — Inpatient Hospital Stay

## 2023-11-03 ENCOUNTER — Ambulatory Visit: Admitting: Rehabilitation

## 2023-11-03 DIAGNOSIS — Z170 Estrogen receptor positive status [ER+]: Secondary | ICD-10-CM | POA: Diagnosis not present

## 2023-11-03 DIAGNOSIS — Z1721 Progesterone receptor positive status: Secondary | ICD-10-CM | POA: Diagnosis not present

## 2023-11-03 DIAGNOSIS — F419 Anxiety disorder, unspecified: Secondary | ICD-10-CM | POA: Diagnosis not present

## 2023-11-03 DIAGNOSIS — Z923 Personal history of irradiation: Secondary | ICD-10-CM | POA: Diagnosis not present

## 2023-11-03 DIAGNOSIS — Z1732 Human epidermal growth factor receptor 2 negative status: Secondary | ICD-10-CM | POA: Diagnosis not present

## 2023-11-03 DIAGNOSIS — C50412 Malignant neoplasm of upper-outer quadrant of left female breast: Secondary | ICD-10-CM | POA: Diagnosis not present

## 2023-11-03 DIAGNOSIS — Z79810 Long term (current) use of selective estrogen receptor modulators (SERMs): Secondary | ICD-10-CM | POA: Diagnosis not present

## 2023-11-04 LAB — FOLLICLE STIMULATING HORMONE: FSH: 7.3 m[IU]/mL

## 2023-11-06 LAB — ESTRADIOL, ULTRA SENS: Estradiol, Sensitive: 78.3 pg/mL

## 2023-11-10 ENCOUNTER — Inpatient Hospital Stay: Attending: Hematology and Oncology | Admitting: Hematology and Oncology

## 2023-11-10 DIAGNOSIS — C50412 Malignant neoplasm of upper-outer quadrant of left female breast: Secondary | ICD-10-CM

## 2023-11-10 DIAGNOSIS — Z170 Estrogen receptor positive status [ER+]: Secondary | ICD-10-CM

## 2023-11-10 NOTE — Progress Notes (Signed)
 HEMATOLOGY-ONCOLOGY TELEPHONE VISIT PROGRESS NOTE  I connected with our patient on 11/10/23 at 11:15 AM EDT by telephone and verified that I am speaking with the correct person using two identifiers.  I discussed the limitations, risks, security and privacy concerns of performing an evaluation and management service by telephone and the availability of in person appointments.  I also discussed with the patient that there may be a patient responsible charge related to this service. The patient expressed understanding and agreed to proceed.   History of Present Illness:    History of Present Illness Rachael Garza is a 48 year old female who presents for follow-up regarding her hormonal status and IUD management.  Recent blood work shows an estradiol level of 75 and an FSH level of 7.3. She is surprised by these results, as she thought she might be closer to menopause. She currently uses an IUD, which is planned to remain in place until the spring of 2027.    Oncology History  Malignant neoplasm of upper-outer quadrant of left breast in female, estrogen receptor positive (HCC)  07/29/2020 Initial Diagnosis    Palpable left breast mass (very dense breasts), Mammogram revealed 2.9 cm masss, MRI 3.2 cm mass: Biopsy Grade 2 IDC ER 95%, PR 95%, Her 2 Neg, Ki 67 15% MRI on Right breast: 3.7 cm NME LIQ and 1.2 cm NME UIQ: Needs biopsies   08/06/2020 Cancer Staging   Staging form: Breast, AJCC 8th Edition - Clinical stage from 08/06/2020: Stage IB (cT2, cN0, cM0, G2, ER+, PR+, HER2-) - Signed by Odean Potts, MD on 08/06/2020 Stage prefix: Initial diagnosis Histologic grading system: 3 grade system Laterality: Left Staged by: Pathologist and managing physician Stage used in treatment planning: Yes National guidelines used in treatment planning: Yes Type of national guideline used in treatment planning: NCCN   08/13/2020 Genetic Testing   No pathogenic variants detected in [facility] BRCAPlus Panel or  CancerNext-Expanded +RNAinsight Panel.  Variant of uncertain significance detected in BLM at  p.A914T (c.2740G>A).  The report dates are August 13, 2020 and August 18, 2020, respectively.    The BRCAplus panel offered by [employer] and includes sequencing and deletion/duplication analysis for the following 8 genes: ATM, BRCA1, BRCA2, CDH1, CHEK2, PALB2, PTEN, and TP53.   The CancerNext-Expanded gene panel offered by [facility] and includes sequencing, rearrangement, and RNA analysis for the following 77 genes: AIP, ALK, APC, ATM, AXIN2, BAP1, BARD1, BLM, BMPR1A, BRCA1, BRCA2, BRIP1, CDC73, CDH1, CDK4, CDKN1B, CDKN2A, CHEK2, CTNNA1, DICER1, FANCC, FH, FLCN, GALNT12, KIF1B, LZTR1, MAX, MEN1, MET, MLH1, MSH2, MSH3, MSH6, MUTYH, NBN, NF1, NF2, NTHL1, PALB2, PHOX2B, PMS2, POT1, PRKAR1A, PTCH1, PTEN, RAD51C, RAD51D, RB1, RECQL, RET, SDHA, SDHAF2, SDHB, SDHC, SDHD, SMAD4, SMARCA4, SMARCB1, SMARCE1, STK11, SUFU, TMEM127, TP53, TSC1, TSC2, VHL and XRCC2 (sequencing and deletion/duplication); EGFR, EGLN1, HOXB13, KIT, MITF, PDGFRA, POLD1, and POLE (sequencing only); EPCAM and GREM1 (deletion/duplication only).    09/09/2020 Surgery   Right lumpectomy: PASH Right lumpectomy 2.: PASH Left lumpectomy: Grade 2 IDC 2.9 cm, intermediate grade DCIS, margins negative, 0/3 lymph nodes negative, ER 95%, PR 95%, HER2 equal vocal FISH negative, Ki-67 15%   09/09/2020 Oncotype testing   15/4%   09/22/2020 Cancer Staging   Staging form: Breast, AJCC 8th Edition - Pathologic: Stage IA (pT2, pN0, cM0, G2, ER+, PR+, HER2-, Oncotype DX score: 15) - Signed by Odean Potts, MD on 09/22/2020 Stage prefix: Initial diagnosis Multigene prognostic tests performed: Oncotype DX Recurrence score range: Greater than or equal to  11 Histologic grading system: 3 grade system   10/15/2020 - 11/11/2020 Radiation Therapy   Adjuvant radiation   11/27/2020 -  Anti-estrogen oral therapy   Tamoxifen  20 mg daily     REVIEW OF SYSTEMS:    Constitutional: Denies fevers, chills or abnormal weight loss All other systems were reviewed with the patient and are negative. Observations/Objective:     Assessment Plan:  Malignant neoplasm of upper-outer quadrant of left breast in female, estrogen receptor positive (HCC) 09/09/2020: Right lumpectomy: PASH, Right lumpectomy 2.: PASH Left lumpectomy: Grade 2 IDC 2.9 cm, intermediate grade DCIS, margins negative, 0/3 lymph nodes negative, ER 95%, PR 95%, HER2 equal vocal FISH negative, Ki-67 15% Oncotype DX: Score 15, distant recurrence at 9 years: 4%   Treatment plan: 1.  Adjuvant radiation 10/15/2020-11/11/2020 2. followed by adjuvant antiestrogen therapy with tamoxifen  20 mg daily x10 years 11/2020    Anxiety:  She was able to discontinue Effexor   Weight: Stabilized   Tamoxifen  toxicities:    1.  Mild hot flashes: Not bothering her 2. fatigue: Uncertain if it is related to tamoxifen . Patient wants to try tart cherry extract to see if it helps her joint symptoms.   Lab review 11/03/2023: Estradiol 75.3, FSH 7.3: Patient is premenopausal.  Therefore she will continue with tamoxifen   Breast cancer surveillance: 1.  Breast exam 07/29/2022: Benign 2.  Contrast-enhanced mammogram 04/19/2023: Benign breast density category D  Will plan to do breast MRIs every other year. I ordered a contrast-enhanced mammogram to be done in March 2026. --------------------------------- Assessment and Plan Assessment & Plan Malignant neoplasm of upper-outer quadrant of left breast Currently on Tamoxifen  for estrogen receptor-positive breast cancer. Estradiol and FSH levels confirm active ovarian function, supporting Tamoxifen  use. - Continue Tamoxifen  20 mg orally daily. - Monitor blood work annually to assess menopausal status. - Maintain current IUD (Levonorgestrel) until spring 2027.      I discussed the assessment and treatment plan with the patient. The patient was provided an opportunity to  ask questions and all were answered. The patient agreed with the plan and demonstrated an understanding of the instructions. The patient was advised to call back or seek an in-person evaluation if the symptoms worsen or if the condition fails to improve as anticipated.   I provided 20 minutes of non-face-to-face time during this encounter.  This includes time for charting and coordination of care   Naomi MARLA Chad, MD

## 2023-11-10 NOTE — Assessment & Plan Note (Signed)
 09/09/2020: Right lumpectomy: PASH, Right lumpectomy 2.: PASH Left lumpectomy: Grade 2 IDC 2.9 cm, intermediate grade DCIS, margins negative, 0/3 lymph nodes negative, ER 95%, PR 95%, HER2 equal vocal FISH negative, Ki-67 15% Oncotype DX: Score 15, distant recurrence at 9 years: 4%   Treatment plan: 1.  Adjuvant radiation 10/15/2020-11/11/2020 2. followed by adjuvant antiestrogen therapy with tamoxifen  20 mg daily x10 years 11/2020    Anxiety:  She was able to discontinue Effexor   Weight: Stabilized   Tamoxifen  toxicities:    1.  Mild hot flashes: Not bothering her 2. fatigue: Uncertain if it is related to tamoxifen . Patient wants to try tart cherry extract to see if it helps her joint symptoms.   Lab review 11/03/2023: Estradiol 75.3, FSH 7.3: Patient is premenopausal.  Therefore she will continue with tamoxifen   Breast cancer surveillance: 1.  Breast exam 07/29/2022: Benign 2.  Contrast-enhanced mammogram 04/19/2023: Benign breast density category D  Will plan to do breast MRIs every other year. I ordered a contrast-enhanced mammogram to be done in March 2026.

## 2024-01-03 DIAGNOSIS — M545 Low back pain, unspecified: Secondary | ICD-10-CM | POA: Diagnosis not present

## 2024-11-01 ENCOUNTER — Ambulatory Visit: Admitting: Hematology and Oncology
